# Patient Record
Sex: Female | Born: 1990 | Race: White | Hispanic: No | Marital: Single | Smoking: Never smoker
Health system: Southern US, Community
[De-identification: ages and names within clinical notes are randomized; demographics above are authoritative.]

## PROBLEM LIST (undated history)

## (undated) DIAGNOSIS — G43909 Migraine, unspecified, not intractable, without status migrainosus: Secondary | ICD-10-CM

## (undated) DIAGNOSIS — E063 Autoimmune thyroiditis: Secondary | ICD-10-CM

## (undated) DIAGNOSIS — K824 Cholesterolosis of gallbladder: Secondary | ICD-10-CM

## (undated) DIAGNOSIS — K639 Disease of intestine, unspecified: Secondary | ICD-10-CM

## (undated) DIAGNOSIS — E041 Nontoxic single thyroid nodule: Secondary | ICD-10-CM

## (undated) DIAGNOSIS — K219 Gastro-esophageal reflux disease without esophagitis: Secondary | ICD-10-CM

## (undated) DIAGNOSIS — T7840XA Allergy, unspecified, initial encounter: Secondary | ICD-10-CM

## (undated) HISTORY — DX: Autoimmune thyroiditis: E06.3

## (undated) HISTORY — DX: Gastro-esophageal reflux disease without esophagitis: K21.9

## (undated) HISTORY — DX: Disease of intestine, unspecified: K63.9

## (undated) HISTORY — DX: Allergy, unspecified, initial encounter: T78.40XA

## (undated) HISTORY — DX: Migraine, unspecified, not intractable, without status migrainosus: G43.909

## (undated) HISTORY — DX: Nontoxic single thyroid nodule: E04.1

## (undated) HISTORY — PX: CHOLECYSTECTOMY: SHX55

## (undated) HISTORY — DX: Cholesterolosis of gallbladder: K82.4

## (undated) HISTORY — PX: WISDOM TOOTH EXTRACTION: SHX21

---
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
## 2012-02-24 DIAGNOSIS — E063 Autoimmune thyroiditis: Secondary | ICD-10-CM

## 2012-02-24 DIAGNOSIS — E041 Nontoxic single thyroid nodule: Secondary | ICD-10-CM

## 2012-02-24 HISTORY — DX: Nontoxic single thyroid nodule: E04.1

## 2012-02-24 HISTORY — DX: Autoimmune thyroiditis: E06.3

## 2012-07-26 ENCOUNTER — Encounter: Payer: Self-pay | Admitting: Internal Medicine

## 2012-07-26 ENCOUNTER — Ambulatory Visit (INDEPENDENT_AMBULATORY_CARE_PROVIDER_SITE_OTHER): Payer: BC Managed Care – PPO | Admitting: Internal Medicine

## 2012-07-26 VITALS — BP 102/58 | HR 94 | Temp 98.4°F | Resp 10 | Ht 68.0 in | Wt 122.0 lb

## 2012-07-26 DIAGNOSIS — E063 Autoimmune thyroiditis: Secondary | ICD-10-CM

## 2012-07-26 NOTE — Patient Instructions (Signed)
Please have labs drawn in 1 month and come for a visit in 4 months.

## 2012-07-26 NOTE — Progress Notes (Signed)
Subjective:     Patient ID: Meghan Hayes, female   DOB: [birth year], 22 y.o.   MRN: [id number]  HPI Pt is a 22 y/o woman self-referred for evaluation for hypothyroidism.  Patient tells me that her thyroid tests have been checked for the last 4-5 years but they were normal. She was dx with hypothyroidism in 05/2012, by her ObGyn. Pt remembersthat TSH was around 6 (no records available). She was then referred to [facility] >> obtained the following tests:  TSH 2.46 in 06/27/2012  Anti-TPO 771 (<34) in 06/29/2012. She was recommended to take Synthroid 25 mcg but she did not start it and wanted a second opinion about her condition.  No FH of thyroid ds or AI ds.   She complains of the following: - temperature: body is cold, but face burning >> occasionally hot in body, too - feels SOB, like something sitting on chest, occas. getting a sharp pain with this - arms and legs feel numb, tingling, "like noodles" - dizzy all the time, orthostasis - exhausted, cannot fall asleep at night, cannot get rested, wakes up frequently - occasional tremors, feels like fainting (never actually fainted before) - irritable, moody, feels "out of it", emotional - anxiety >> stressed, worse lately; no depression - palpitations, even at rest, but increased heart rate with exertion - she does not know when her menstrual cycle would come - did not start OCPs, but has tried 2 different OCPs in the past, one caused her a lot of pain.  - has HAs frequently - she has sometimes nauseated when she gets hungry - gains and loses 5 lbs (in the past did not fluctuating that much)  She has had this spx "forever" but they would come separately in the past, while now all are present continuously.   She does not have a primary care physician.  Review of Systems Constitutional: + fatigue, + feeling excessively hot in the face and cold in the body, + hot flashes, + poor sleep Eyes: + blurry vision, no xerophthalmia ENT:  + sore throat, +swelling in throat, + dysphagia/no odynophagia, no hoarseness; +ringing in ears Cardiovascular: + CP/+ SOB/+ palpitations/leg swelling Respiratory: no cough/+ SOB/ + wheezing Gastrointestinal: +  N/no V/D/+ C, + GERD  Musculoskeletal: +  Muscle/+ joint aches, + joint swelling Skin: no rashes, + easy bruising,+ stretch marks, + hair loss and thinning Neurological: + tremors/numbness/tingling/+ dizziness, + headache Psychiatric: no depression/+ anxiety + Irregular menstrual cycles  Past Medical History  Diagnosis Date  . Thyroid disease   . Allergy     seasonal, nuts, wheat, corn, soy   PSx H:  tonsilectomy 2008  History   Social History  . Marital Status: Single    Spouse Name: N/A    Number of Children: 0   Occupational History  . 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 Smoking status: Never Smoker   . Smokeless tobacco: Not on file  . Alcohol Use: No  . Drug Use: No  . Sexually Active: No   Social History Narrative   Regular exercise: no   Caffeine use: no   NKDA  Family History  Problem Relation Age of Onset  . Hypertension Father   . Allergies Father   . Cancer Maternal Grandmother     colon  . Hypertension Maternal Grandmother    Objective:   Physical Exam BP 102/58  Pulse 94  Temp(Src) 98.4 F (36.9 C) (  Oral)  Resp 10  Ht 5\' 8"  (1.727 m)  Wt 122 lb (55.339 kg)  BMI 18.55 kg/m2  SpO2 97%  LMP 07/21/2012 Weight: 122 lb (55.339 kg)  Wt Readings from Last 3 Encounters:  07/26/12 122 lb (55.339 kg)  Constitutional: normal weight, in NAD Eyes: PERRLA, EOMI, no exophthalmos ENT: moist mucous membranes, mild symmetric thyromegaly, no cervical lymphadenopathy Cardiovascular: RRR, No MRG Respiratory: CTA B Gastrointestinal: abdomen soft, NT, ND, BS+ Musculoskeletal: no deformities, strength intact in all 4 Skin: moist, warm, no rashes Neurological: mild tremor with outstretched hands, DTR  normal in all 4    Assessment:     1. Euthyroid Hashimoto thyroiditis     Plan:     I had a long discussion with patient and her mom about the meaning of TSH, free T4 and free T3 and also her increased thyroid antibodies. I explained that this is an autoimmune disease, however it has not affected her thyroid function tests yet, based on the labs that they bring with them today (TSH is 2.4).  I also explained that there can be increased variability of TSH, especially in a patient with high anti-TPO antibodies. Discussed about the fact that a high antibody titer can cause her a sensation of swelling in her anterior neck and sometimes even choking, associated with increased inflammation of the gland. This can happen in periods of stress, illness, lack of sleep, which can render her immune intolerant and make her more sensitive to antibody action, even though the thyroid tests might be normal. -  For now, I recommended that she does not start Synthroid, but will recheck her tests in another month. She opted to have these labs drawn at [facility]. - I will see her back in 4 months to recheck her labs and her symptoms - I recommended that she has a primary care physician, to integrate her care and make sure that her general health status (aside from the thyroid) is normal. She asked me whether I could refer her to [facility] office (done).

## 2012-08-30 ENCOUNTER — Encounter: Payer: Self-pay | Admitting: Nurse Practitioner

## 2012-08-30 ENCOUNTER — Ambulatory Visit (INDEPENDENT_AMBULATORY_CARE_PROVIDER_SITE_OTHER): Payer: BC Managed Care – PPO | Admitting: Nurse Practitioner

## 2012-08-30 VITALS — BP 98/60 | HR 95 | Temp 98.0°F | Ht 68.0 in | Wt 123.0 lb

## 2012-08-30 DIAGNOSIS — F419 Anxiety disorder, unspecified: Secondary | ICD-10-CM

## 2012-08-30 DIAGNOSIS — Z0000 Encounter for general adult medical examination without abnormal findings: Secondary | ICD-10-CM

## 2012-08-30 DIAGNOSIS — F411 Generalized anxiety disorder: Secondary | ICD-10-CM

## 2012-08-30 DIAGNOSIS — R0789 Other chest pain: Secondary | ICD-10-CM

## 2012-08-30 DIAGNOSIS — R636 Underweight: Secondary | ICD-10-CM

## 2012-08-30 DIAGNOSIS — R002 Palpitations: Secondary | ICD-10-CM

## 2012-08-30 NOTE — Patient Instructions (Addendum)
Your EKG looks great, but we will set up exercise stress test, given the uncomfortable feeling you have when you exercise. If you decide you want to explore symptoms of anxiety, I am happy to refer you to for further evaluation. You look great! Pleasure to meet you.    Preventive Care for Adults, Female A healthy lifestyle and preventive care can promote health and wellness. Preventive health guidelines for women include the following key practices.  A routine yearly physical is a good way to check with your caregiver about your health and preventive screening. It is a chance to share any concerns and updates on your health, and to receive a thorough exam.  Visit your dentist for a routine exam and preventive care every 6 months. Brush your teeth twice a day and floss once a day. Good oral hygiene prevents tooth decay and gum disease.  The frequency of eye exams is based on your age, health, family medical history, use of contact lenses, and other factors. Follow your caregiver's recommendations for frequency of eye exams.  Eat a healthy diet. Foods like vegetables, fruits, whole grains, low-fat dairy products, and lean protein foods contain the nutrients you need without too many calories. Decrease your intake of foods high in solid fats, added sugars, and salt. Eat the right amount of calories for you.Get information about a proper diet from your caregiver, if necessary.  Regular physical exercise is one of the most important things you can do for your health. Most adults should get at least 150 minutes of moderate-intensity exercise (any activity that increases your heart rate and causes you to sweat) each week. In addition, most adults need muscle-strengthening exercises on 2 or more days a week.  Maintain a healthy weight. The body mass index (BMI) is a screening tool to identify possible weight problems. It provides an estimate of body fat based on height and weight. Your caregiver can help  determine your BMI, and can help you achieve or maintain a healthy weight.For adults 20 years and older:  A BMI below 18.5 is considered underweight.  A BMI of 18.5 to 24.9 is normal.  A BMI of 25 to 29.9 is considered overweight.  A BMI of 30 and above is considered obese.  Maintain normal blood lipids and cholesterol levels by exercising and minimizing your intake of saturated fat. Eat a balanced diet with plenty of fruit and vegetables. Blood tests for lipids and cholesterol should begin at age 37 and be repeated every 5 years. If your lipid or cholesterol levels are high, you are over 50, or you are at high risk for heart disease, you may need your cholesterol levels checked more frequently.Ongoing high lipid and cholesterol levels should be treated with medicines if diet and exercise are not effective.  If you smoke, find out from your caregiver how to quit. If you do not use tobacco, do not start.  If you are pregnant, do not drink alcohol. If you are breastfeeding, be very cautious about drinking alcohol. If you are not pregnant and choose to drink alcohol, do not exceed 1 drink per day. One drink is considered to be 12 ounces (355 mL) of beer, 5 ounces (148 mL) of wine, or 1.5 ounces (44 mL) of liquor.  Avoid use of street drugs. Do not share needles with anyone. Ask for help if you need support or instructions about stopping the use of drugs.  High blood pressure causes heart disease and increases the risk of stroke. Your  blood pressure should be checked at least every 1 to 2 years. Ongoing high blood pressure should be treated with medicines if weight loss and exercise are not effective.  If you are 52 to 20 years old, ask your caregiver if you should take aspirin to prevent strokes.  Diabetes screening involves taking a blood sample to check your fasting blood sugar level. This should be done once every 3 years, after age 87, if you are within normal weight and without risk factors  for diabetes. Testing should be considered at a younger age or be carried out more frequently if you are overweight and have at least 1 risk factor for diabetes.  Breast cancer screening is essential preventive care for women. You should practice "breast self-awareness." This means understanding the normal appearance and feel of your breasts and may include breast self-examination. Any changes detected, no matter how small, should be reported to a caregiver. Women in their 83s and 30s should have a clinical breast exam (CBE) by a caregiver as part of a regular health exam every 1 to 3 years. After age 37, women should have a CBE every year. Starting at age 54, women should consider having a mammography (breast X-ray test) every year. Women who have a family history of breast cancer should talk to their caregiver about genetic screening. Women at a high risk of breast cancer should talk to their caregivers about having magnetic resonance imaging (MRI) and a mammography every year.  The Pap test is a screening test for cervical cancer. A Pap test can show cell changes on the cervix that might become cervical cancer if left untreated. A Pap test is a procedure in which cells are obtained and examined from the lower end of the uterus (cervix).  Women should have a Pap test starting at age 16.  Between ages 65 and 15, Pap tests should be repeated every 2 years.  Beginning at age 16, you should have a Pap test every 3 years as long as the past 3 Pap tests have been normal.  Some women have medical problems that increase the chance of getting cervical cancer. Talk to your caregiver about these problems. It is especially important to talk to your caregiver if a new problem develops soon after your last Pap test. In these cases, your caregiver may recommend more frequent screening and Pap tests.  The above recommendations are the same for women who have or have not gotten the vaccine for human papillomavirus  (HPV).  If you had a hysterectomy for a problem that was not cancer or a condition that could lead to cancer, then you no longer need Pap tests. Even if you no longer need a Pap test, a regular exam is a good idea to make sure no other problems are starting.  If you are between ages 63 and 85, and you have had normal Pap tests going back 10 years, you no longer need Pap tests. Even if you no longer need a Pap test, a regular exam is a good idea to make sure no other problems are starting.  If you have had past treatment for cervical cancer or a condition that could lead to cancer, you need Pap tests and screening for cancer for at least 20 years after your treatment.  If Pap tests have been discontinued, risk factors (such as a new sexual partner) need to be reassessed to determine if screening should be resumed.  The HPV test is an additional test that  may be used for cervical cancer screening. The HPV test looks for the virus that can cause the cell changes on the cervix. The cells collected during the Pap test can be tested for HPV. The HPV test could be used to screen women aged 20 years and older, and should be used in women of any age who have unclear Pap test results. After the age of 57, women should have HPV testing at the same frequency as a Pap test.  Colorectal cancer can be detected and often prevented. Most routine colorectal cancer screening begins at the age of 25 and continues through age 65. However, your caregiver may recommend screening at an earlier age if you have risk factors for colon cancer. On a yearly basis, your caregiver may provide home test kits to check for hidden blood in the stool. Use of a small camera at the end of a tube, to directly examine the colon (sigmoidoscopy or colonoscopy), can detect the earliest forms of colorectal cancer. Talk to your caregiver about this at age 45, when routine screening begins. Direct examination of the colon should be repeated every 5  to 10 years through age 83, unless early forms of pre-cancerous polyps or small growths are found.  Hepatitis C blood testing is recommended for all people born from [birth year] through 1965 and any individual with known risks for hepatitis C.  Practice safe sex. Use condoms and avoid high-risk sexual practices to reduce the spread of sexually transmitted infections (STIs). STIs include gonorrhea, chlamydia, syphilis, trichomonas, herpes, HPV, and human immunodeficiency virus (HIV). Herpes, HIV, and HPV are viral illnesses that have no cure. They can result in disability, cancer, and death. Sexually active women aged 86 and younger should be checked for chlamydia. Older women with new or multiple partners should also be tested for chlamydia. Testing for other STIs is recommended if you are sexually active and at increased risk.  Osteoporosis is a disease in which the bones lose minerals and strength with aging. This can result in serious bone fractures. The risk of osteoporosis can be identified using a bone density scan. Women ages 38 and over and women at risk for fractures or osteoporosis should discuss screening with their caregivers. Ask your caregiver whether you should take a calcium supplement or vitamin D to reduce the rate of osteoporosis.  Menopause can be associated with physical symptoms and risks. Hormone replacement therapy is available to decrease symptoms and risks. You should talk to your caregiver about whether hormone replacement therapy is right for you.  Use sunscreen with sun protection factor (SPF) of 30 or more. Apply sunscreen liberally and repeatedly throughout the day. You should seek shade when your shadow is shorter than you. Protect yourself by wearing long sleeves, pants, a wide-brimmed hat, and sunglasses year round, whenever you are outdoors.  Once a month, do a whole body skin exam, using a mirror to look at the skin on your back. Notify your caregiver of new moles, moles that  have irregular borders, moles that are larger than a pencil eraser, or moles that have changed in shape or color.  Stay current with required immunizations.  Influenza. You need a dose every fall (or winter). The composition of the flu vaccine changes each year, so being vaccinated once is not enough.  Pneumococcal polysaccharide. You need 1 to 2 doses if you smoke cigarettes or if you have certain chronic medical conditions. You need 1 dose at age 56 (or older) if you have never  been vaccinated.  Tetanus, diphtheria, pertussis (Tdap, Td). Get 1 dose of Tdap vaccine if you are younger than age 20, are over 104 and have contact with an infant, are a Research scientist (physical sciences), are pregnant, or simply want to be protected from whooping cough. After that, you need a Td booster dose every 10 years. Consult your caregiver if you have not had at least 3 tetanus and diphtheria-containing shots sometime in your life or have a deep or dirty wound.  HPV. You need this vaccine if you are a woman age 24 or younger. The vaccine is given in 3 doses over 6 months.  Measles, mumps, rubella (MMR). You need at least 1 dose of MMR if you were born in 1957 or later. You may also need a second dose.  Meningococcal. If you are age 32 to 101 and a first-year college student living in a residence hall, or have one of several medical conditions, you need to get vaccinated against meningococcal disease. You may also need additional booster doses.  Zoster (shingles). If you are age 99 or older, you should get this vaccine.  Varicella (chickenpox). If you have never had chickenpox or you were vaccinated but received only 1 dose, talk to your caregiver to find out if you need this vaccine.  Hepatitis A. You need this vaccine if you have a specific risk factor for hepatitis A virus infection or you simply wish to be protected from this disease. The vaccine is usually given as 2 doses, 6 to 18 months apart.  Hepatitis B. You need this  vaccine if you have a specific risk factor for hepatitis B virus infection or you simply wish to be protected from this disease. The vaccine is given in 3 doses, usually over 6 months. Preventive Services / Frequency Ages 12 to 49  Blood pressure check.** / Every 1 to 2 years.  Lipid and cholesterol check.** / Every 5 years beginning at age 46.  Clinical breast exam.** / Every 3 years for women in their 81s and 30s.  Pap test.** / Every 2 years from ages 77 through 65. Every 3 years starting at age 35 through age 40 or 47 with a history of 3 consecutive normal Pap tests.  HPV screening.** / Every 3 years from ages 34 through ages 81 to 28 with a history of 3 consecutive normal Pap tests.  Hepatitis C blood test.** / For any individual with known risks for hepatitis C.  Skin self-exam. / Monthly.  Influenza immunization.** / Every year.  Pneumococcal polysaccharide immunization.** / 1 to 2 doses if you smoke cigarettes or if you have certain chronic medical conditions.  Tetanus, diphtheria, pertussis (Tdap, Td) immunization. / A one-time dose of Tdap vaccine. After that, you need a Td booster dose every 10 years.  HPV immunization. / 3 doses over 6 months, if you are 57 and younger.  Measles, mumps, rubella (MMR) immunization. / You need at least 1 dose of MMR if you were born in 1957 or later. You may also need a second dose.  Meningococcal immunization. / 1 dose if you are age 89 to 5 and a first-year college student living in a residence hall, or have one of several medical conditions, you need to get vaccinated against meningococcal disease. You may also need additional booster doses.  Varicella immunization.** / Consult your caregiver.  Hepatitis A immunization.** / Consult your caregiver. 2 doses, 6 to 18 months apart.  Hepatitis B immunization.** / Consult your caregiver. 3  doses usually over 6 months. Ages 6 to 14  Blood pressure check.** / Every 1 to 2 years.  Lipid  and cholesterol check.** / Every 5 years beginning at age 61.  Clinical breast exam.** / Every year after age 4.  Mammogram.** / Every year beginning at age 66 and continuing for as long as you are in good health. Consult with your caregiver.  Pap test.** / Every 3 years starting at age 81 through age 56 or 33 with a history of 3 consecutive normal Pap tests.  HPV screening.** / Every 3 years from ages 13 through ages 63 to 25 with a history of 3 consecutive normal Pap tests.  Fecal occult blood test (FOBT) of stool. / Every year beginning at age 85 and continuing until age 25. You may not need to do this test if you get a colonoscopy every 10 years.  Flexible sigmoidoscopy or colonoscopy.** / Every 5 years for a flexible sigmoidoscopy or every 10 years for a colonoscopy beginning at age 56 and continuing until age 72.  Hepatitis C blood test.** / For all people born from [birth year] through 1965 and any individual with known risks for hepatitis C.  Skin self-exam. / Monthly.  Influenza immunization.** / Every year.  Pneumococcal polysaccharide immunization.** / 1 to 2 doses if you smoke cigarettes or if you have certain chronic medical conditions.  Tetanus, diphtheria, pertussis (Tdap, Td) immunization.** / A one-time dose of Tdap vaccine. After that, you need a Td booster dose every 10 years.  Measles, mumps, rubella (MMR) immunization. / You need at least 1 dose of MMR if you were born in 1957 or later. You may also need a second dose.  Varicella immunization.** / Consult your caregiver.  Meningococcal immunization.** / Consult your caregiver.  Hepatitis A immunization.** / Consult your caregiver. 2 doses, 6 to 18 months apart.  Hepatitis B immunization.** / Consult your caregiver. 3 doses, usually over 6 months. Ages 78 and over  Blood pressure check.** / Every 1 to 2 years.  Lipid and cholesterol check.** / Every 5 years beginning at age 10.  Clinical breast exam.** / Every year  after age 41.  Mammogram.** / Every year beginning at age 57 and continuing for as long as you are in good health. Consult with your caregiver.  Pap test.** / Every 3 years starting at age 52 through age 61 or 85 with a 3 consecutive normal Pap tests. Testing can be stopped between 65 and 70 with 3 consecutive normal Pap tests and no abnormal Pap or HPV tests in the past 10 years.  HPV screening.** / Every 3 years from ages 29 through ages 55 or 82 with a history of 3 consecutive normal Pap tests. Testing can be stopped between 65 and 70 with 3 consecutive normal Pap tests and no abnormal Pap or HPV tests in the past 10 years.  Fecal occult blood test (FOBT) of stool. / Every year beginning at age 104 and continuing until age 50. You may not need to do this test if you get a colonoscopy every 10 years.  Flexible sigmoidoscopy or colonoscopy.** / Every 5 years for a flexible sigmoidoscopy or every 10 years for a colonoscopy beginning at age 39 and continuing until age 46.  Hepatitis C blood test.** / For all people born from [birth year] through 1963 and any individual with known risks for hepatitis C.  Osteoporosis screening.** / A one-time screening for women ages 30 and over and women at risk  for fractures or osteoporosis.  Skin self-exam. / Monthly.  Influenza immunization.** / Every year.  Pneumococcal polysaccharide immunization.** / 1 dose at age 27 (or older) if you have never been vaccinated.  Tetanus, diphtheria, pertussis (Tdap, Td) immunization. / A one-time dose of Tdap vaccine if you are over 65 and have contact with an infant, are a Research scientist (physical sciences), or simply want to be protected from whooping cough. After that, you need a Td booster dose every 10 years.  Varicella immunization.** / Consult your caregiver.  Meningococcal immunization.** / Consult your caregiver.  Hepatitis A immunization.** / Consult your caregiver. 2 doses, 6 to 18 months apart.  Hepatitis B immunization.** /  Check with your caregiver. 3 doses, usually over 6 months. ** Family history and personal history of risk and conditions may change your caregiver's recommendations. Document Released: 04/07/2001 Document Revised: 05/04/2011 Document Reviewed: 07/07/2010 [facility] Patient Information 2014 [place], [place].

## 2012-08-30 NOTE — Progress Notes (Signed)
Subjective:    Meghan Hayes is a 22 y.o. female who presents to establish care. She is accompanied by her mother. She was referred by Dr. 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 Meghan Hayes has seen GI in past for chronic constipation. Although she is underweight, she feels that she is too heavy. She c/o palpitations and pain in chest with exertion. She states she does not like to exercise because it feels painful when her heart beats rapidly. Palpitations occur randomly and are intermittent, lasting a few seconds. She expresses concern that her heart beats too rapidly, even at rest. Cardiac risk factors include: family history of hyperlipidemia. Aggravating factors: stress/anxiety. Relieving factors: none, avoiding exertion.. Associated symptoms: none. Patient denies: calf pain, cough, leg swelling and slow heart beat. Meghan Hayes does not want to have labs done at our office because she plans to go to [facility] today where bloodwork will be done. I carefully explained that thyroid labs need to be repeated and requested that she ask for TSH, t3 and free T4, and TPO antibodies.  The following portions of the patient's history were reviewed and updated as appropriate: allergies, current medications, past family history, past medical history, past social history, past surgical history and problem list.  Review of Systems Constitutional: negative for anorexia, chills, fevers and positive for fatigue, reports feels bad all the time, perceives her health as poor. Eyes: negative for contacts/glasses, redness and visual disturbance Respiratory: negative for asthma, cough and wheezing Cardiovascular: positive for chest pain, chest pressure/discomfort, palpitations and feels like heart beats too fast, negative for lower extremity edema and syncope Gastrointestinal: positive for constipation and goes 4-5 days without having bowel movement, negative  for diarrhea and vomiting Genitourinary:negative for dysuria Integument/breast: negative for rash Musculoskeletal:negative for arthralgias, back pain and muscle weakness Neurological: negative for coordination problems, headaches and feels dizzy often Behavioral/Psych: positive for anxiety and verbalizes worry multiple concerns with health, negative for aggressive behavior, behavior problems, decreased appetite, excessive alcohol consumption, illegal drug usage, sleep disturbance and tobacco use Endocrine: positive for nothing, negative for diabetic symptoms including blurry vision, polydipsia, polyphagia and polyuria and temperature intolerance   Objective:    BP 98/60  Pulse 95  Temp(Src) 98 F (36.7 C) (Oral)  Ht 5\' 8"  (1.727 m)  Wt 123 lb (55.792 kg)  BMI 18.71 kg/m2  SpO2 98%  LMP 07/21/2012 General appearance: alert, cooperative, appears stated age and mild distress Head: Normocephalic, without obvious abnormality, atraumatic Eyes: conjunctivae/corneas clear. PERRL, EOM's intact. Fundi benign. Ears: normal TM's and external ear canals both ears Throat: lips, mucosa, and tongue normal; teeth and gums normal Neck: no adenopathy, no carotid bruit, supple, symmetrical, trachea midline and thyroid not enlarged, symmetric, no tenderness/mass/nodules Lungs: clear to auscultation bilaterally Heart: regular rate and rhythm, S1, S2 normal, no murmur, click, rub or gallop Abdomen: soft, non-tender; bowel sounds normal; no masses,  no organomegaly Lymph nodes: Cervical, supraclavicular, and axillary nodes normal.  Cardiographics ECG: normal sinus rhythm   Assessment:  1 preventive care-vaccines up to date per pt report, PAP should begin at 20 yo if sexually active, or sooner if symptoms 2 palpitations/chest discomfort with exertion 3 underweight 4 anxiousness: expresses fear regarding health  5 chronic constipation Plan:    1 Next physical in 5 years, Meghan Hayes declined screening  labs, as she plans to go to [facility] today where blood work will be done, asked her to have them send records  to me & Dr. Elvera Lennox 2 ECG shows NSR, refer to cardiology for exercise stress test 3 BMI 18.7, pt thinks she is overweight-concern for body image disorder, has been diagnosed w/euthyroid Hashimoto  4 Offered to refer for psych eval-Ms Hayes is opposed to taking meds for anxiety, suggested psychology for CBT, she is undecided. 5 Increase water intake, begin probiotics-suggest Align  Follow up in 1 month to discuss exercise stress test, continue to eval anxiety level and body image.

## 2012-09-01 ENCOUNTER — Encounter: Payer: Self-pay | Admitting: Nurse Practitioner

## 2012-12-05 ENCOUNTER — Ambulatory Visit (INDEPENDENT_AMBULATORY_CARE_PROVIDER_SITE_OTHER): Payer: BC Managed Care – PPO | Admitting: Internal Medicine

## 2012-12-05 ENCOUNTER — Encounter: Payer: Self-pay | Admitting: Internal Medicine

## 2012-12-05 VITALS — BP 102/60 | HR 97 | Temp 98.5°F | Resp 10 | Wt 122.5 lb

## 2012-12-05 DIAGNOSIS — E063 Autoimmune thyroiditis: Secondary | ICD-10-CM

## 2012-12-05 NOTE — Progress Notes (Signed)
Subjective:     Patient ID: Meghan Hayes, female   DOB: March 09, 1990, 22 y.o.   MRN: [id number]  HPI Pt is a 22 y/o woman self-referred for evaluation for hypothyroidism. Last visit 4 mo ago. She is here with her mother who offers part of the history.  Reviewed hx: Pt was dx with hypothyroidism in 05/2012, by her ObGyn. Pt remembers that TSH was around 6 (no records available). She was then referred to [facility] >> obtained the following tests:  TSH 2.46 in 06/27/2012, Anti-TPO 771 (<34) in 06/29/2012. She was recommended to take Synthroid 25 mcg but she did not start it and wanted a second opinion about her condition.  At last visit, I recommended to stay off Synthroid with repeat labs in 1 mo:  She had TFTs 09/02/2012: TSH 3.4,  TT4 7.9 (4.5-12), free T4 1.10 (0.82-1.77), TT3 121 (71-180), rT3 19.9 (9.2-24.1), ATA 4.3 (0-0.9), antiTPO Abx 466 (0-34) They bring these labs for me to review today.  She still c/o the following - consistent with prior visit: - now more recent dysmenorrhea - temperature: body is cold, but face burning >> occasionally hot in body, too - feels SOB, like something sitting on chest, occas. getting a sharp pain with this - arms and legs feel numb, tingling - dizzy all the time, orthostasis - exhausted, cannot fall asleep at night, cannot get rested, wakes up frequently - occasional tremors, feels like fainting - irritable, moody, feels "out of it", emotional - anxiety; no depression - less palpitations lately - has HAs frequently - she has sometimes nauseated when she gets hungry  She started to have a gluten-free diet >> feels a little better.   She did established care with a primary care physician.  Other labs from 09/02/2012: - normal CMP - vit D 21.7 >> did not start vit D yet - CRP 14.54 (0-3) - lipids: 165/71/63/88 - DHEAS 283.2 (110-431.7) - Insulin 6.4 - CBC with diff normal exc. MCH slightly low and Monocytes 16 % (4-12%) - ESR 6 -  total testosterone: 28.5 (10-55), free T 0.4 (0-2.2) - LH 10.5, FSH 6/9  - Progesterone 1 - unclear what day of the cycle - MTFHR - no mutation  Review of Systems Constitutional: + fatigue, + feeling excessively hot in the face and cold in the body, + hot flashes, + poor sleep Eyes: no blurry vision, no xerophthalmia ENT: + sore throat, +swelling in throat, + dysphagia/no odynophagia, no hoarseness; +ringing in ears Cardiovascular: + CP/+ SOB/no palpitations/leg swelling Respiratory: no cough/+ SOB/ + wheezing Gastrointestinal: +  N/no V/D/+ C, + GERD  Musculoskeletal: +  Muscle/+ joint aches Skin: no rashes Neurological: + tremors/numbness/tingling/ dizziness, + headache + Irregular menstrual cycles - on OCPs  Objective:   Physical Exam BP 102/60  Pulse 97  Temp(Src) 98.5 F (36.9 C) (Oral)  Resp 10  Wt 122 lb 8 oz (55.566 kg)  BMI 18.63 kg/m2  SpO2 97%    Wt Readings from Last 3 Encounters:  08/30/12 123 lb (55.792 kg)  07/26/12 122 lb (55.339 kg)  Constitutional: thin, in NAD Eyes: PERRLA, EOMI, no exophthalmos ENT: moist mucous membranes, mild symmetric thyromegaly, no cervical lymphadenopathy Cardiovascular: RRR, No MRG Respiratory: CTA B Gastrointestinal: abdomen soft, NT, ND, BS+ Musculoskeletal: no deformities, strength intact in all 4 Skin: moist, warm, no rashes Neurological: mild tremor with outstretched hands, DTR normal in all 4    Assessment:     1. Euthyroid Hashimoto thyroiditis  Plan:     - Pt with h/o elevated TPO Antibodies, but with normal thyroid fxn. Last set of labs from 3 mo ago: normal, antiTPO Abs decreased, but still high. Pt feels a little better after starting the gluten diet, but in general has the same sxs as before. Mom confirms that pt looks and seems better. - For now, I again recommended to stay off Synthroid (will take this off her med list)  - I advised her to start vit D as advised  - will recheck her tests today: TSH, Free T4  and free T3 - I will see her back in 6 months to recheck her labs and her symptoms - She established care with Maximino Sarin, NP, at [facility] office   Office Visit on 12/05/2012  Component Date Value Range Status  . TSH 12/05/2012 1.25  0.35 - 5.50 uIU/mL Final  . Free T4 12/05/2012 0.74  0.60 - 1.60 ng/dL Final  . T3, Free 16/11/9602 2.7  2.3 - 4.2 pg/mL Final   Excellent labs >> continue off Synthroid.

## 2012-12-06 ENCOUNTER — Encounter: Payer: Self-pay | Admitting: Internal Medicine

## 2012-12-06 LAB — TSH: TSH: 1.25 u[IU]/mL (ref 0.35–5.50)

## 2012-12-06 LAB — T3, FREE: T3, Free: 2.7 pg/mL (ref 2.3–4.2)

## 2012-12-06 LAB — T4, FREE: Free T4: 0.74 ng/dL (ref 0.60–1.60)

## 2013-06-05 ENCOUNTER — Ambulatory Visit: Payer: BC Managed Care – PPO | Admitting: Internal Medicine

## 2013-07-07 ENCOUNTER — Other Ambulatory Visit: Payer: Self-pay | Admitting: Nurse Practitioner

## 2013-07-07 ENCOUNTER — Ambulatory Visit
Admission: RE | Admit: 2013-07-07 | Discharge: 2013-07-07 | Disposition: A | Discharge status: Home / Self Care | Payer: BC Managed Care – PPO | Source: Ambulatory Visit | Attending: Nurse Practitioner | Admitting: Nurse Practitioner

## 2013-07-07 DIAGNOSIS — E063 Autoimmune thyroiditis: Secondary | ICD-10-CM

## 2014-07-03 ENCOUNTER — Other Ambulatory Visit: Payer: Self-pay | Admitting: Nurse Practitioner

## 2014-07-03 DIAGNOSIS — E063 Autoimmune thyroiditis: Secondary | ICD-10-CM

## 2014-07-06 ENCOUNTER — Ambulatory Visit
Admission: RE | Admit: 2014-07-06 | Discharge: 2014-07-06 | Disposition: A | Discharge status: Home / Self Care | Payer: BLUE CROSS/BLUE SHIELD | Source: Ambulatory Visit | Attending: Nurse Practitioner | Admitting: Nurse Practitioner

## 2014-07-06 DIAGNOSIS — E063 Autoimmune thyroiditis: Secondary | ICD-10-CM

## 2014-08-20 DIAGNOSIS — R1033 Periumbilical pain: Secondary | ICD-10-CM | POA: Insufficient documentation

## 2014-10-11 ENCOUNTER — Other Ambulatory Visit: Payer: Self-pay | Admitting: Surgery

## 2014-10-11 DIAGNOSIS — E063 Autoimmune thyroiditis: Secondary | ICD-10-CM

## 2015-01-04 ENCOUNTER — Ambulatory Visit
Admission: RE | Admit: 2015-01-04 | Discharge: 2015-01-04 | Disposition: A | Discharge status: Home / Self Care | Payer: BLUE CROSS/BLUE SHIELD | Source: Ambulatory Visit | Attending: Surgery | Admitting: Surgery

## 2015-01-04 DIAGNOSIS — E063 Autoimmune thyroiditis: Secondary | ICD-10-CM

## 2015-01-11 ENCOUNTER — Other Ambulatory Visit (INDEPENDENT_AMBULATORY_CARE_PROVIDER_SITE_OTHER): Payer: BLUE CROSS/BLUE SHIELD

## 2015-01-11 ENCOUNTER — Ambulatory Visit (INDEPENDENT_AMBULATORY_CARE_PROVIDER_SITE_OTHER): Payer: BLUE CROSS/BLUE SHIELD | Admitting: Gastroenterology

## 2015-01-11 ENCOUNTER — Other Ambulatory Visit: Payer: BLUE CROSS/BLUE SHIELD

## 2015-01-11 ENCOUNTER — Encounter: Payer: Self-pay | Admitting: Gastroenterology

## 2015-01-11 VITALS — BP 106/68 | HR 70 | Ht 68.0 in | Wt 125.0 lb

## 2015-01-11 DIAGNOSIS — R109 Unspecified abdominal pain: Secondary | ICD-10-CM

## 2015-01-11 DIAGNOSIS — K824 Cholesterolosis of gallbladder: Secondary | ICD-10-CM

## 2015-01-11 LAB — CBC WITH DIFFERENTIAL/PLATELET
BASOS PCT: 0.6 % (ref 0.0–3.0)
Basophils Absolute: 0 10*3/uL (ref 0.0–0.1)
EOS PCT: 0.5 % (ref 0.0–5.0)
Eosinophils Absolute: 0 10*3/uL (ref 0.0–0.7)
HCT: 42.3 % (ref 36.0–46.0)
HEMOGLOBIN: 14 g/dL (ref 12.0–15.0)
LYMPHS PCT: 30.5 % (ref 12.0–46.0)
Lymphs Abs: 2.4 10*3/uL (ref 0.7–4.0)
MCHC: 33.1 g/dL (ref 30.0–36.0)
MCV: 88.9 fl (ref 78.0–100.0)
MONOS PCT: 9.1 % (ref 3.0–12.0)
Monocytes Absolute: 0.7 10*3/uL (ref 0.1–1.0)
NEUTROS PCT: 59.3 % (ref 43.0–77.0)
Neutro Abs: 4.6 10*3/uL (ref 1.4–7.7)
PLATELETS: 415 10*3/uL — AB (ref 150.0–400.0)
RBC: 4.76 Mil/uL (ref 3.87–5.11)
RDW: 12.7 % (ref 11.5–15.5)
WBC: 7.8 10*3/uL (ref 4.0–10.5)

## 2015-01-11 LAB — COMPREHENSIVE METABOLIC PANEL
ALT: 8 U/L (ref 0–35)
AST: 14 U/L (ref 0–37)
Albumin: 4.7 g/dL (ref 3.5–5.2)
Alkaline Phosphatase: 61 U/L (ref 39–117)
BILIRUBIN TOTAL: 0.9 mg/dL (ref 0.2–1.2)
BUN: 8 mg/dL (ref 6–23)
CO2: 28 meq/L (ref 19–32)
Calcium: 9.8 mg/dL (ref 8.4–10.5)
Chloride: 104 mEq/L (ref 96–112)
Creatinine, Ser: 0.76 mg/dL (ref 0.40–1.20)
GFR: 100.52 mL/min (ref >60.00)
GLUCOSE: 83 mg/dL (ref 70–99)
Potassium: 4.3 mEq/L (ref 3.5–5.1)
SODIUM: 139 meq/L (ref 135–145)
Total Protein: 7.7 g/dL (ref 6.0–8.3)

## 2015-01-11 MED ORDER — LIDOCAINE 5 % EX PTCH: 1.0000 | MEDICATED_PATCH | CUTANEOUS | Status: DC

## 2015-01-11 NOTE — Patient Instructions (Signed)
You have been scheduled for an abdominal ultrasound at [facility] (1st floor of hospital) on 01/18/2015 at 8:30am. Please arrive 15 minutes prior to your appointment for registration. Make certain not to have anything to eat or drink 6 hours prior to your appointment. Should you need to reschedule your appointment, please contact radiology at [phone]. This test typically takes about 30 minutes to perform.   Your physician has requested that you go to the basement for lab work before leaving today.   We have sent the following medications to your pharmacy for you to pick up at your convenience: Lidocaine Patch

## 2015-01-11 NOTE — Progress Notes (Signed)
HPI :  24 y/o female seen in consultation for abdominal pain.   Pain is in the right lower quadrant, but can be in the LLQ at times but mostly RLQ. She reports it started in June of this year. She reports since it first started it can be quite sensitive and tender to the touch, however there are fluctuations in severity. Pain rated roughly 5-6/10 at baseline, rated 10/10 when severe and flaring. She reports severe pain rated 10/10 about 4 times since June or so. Pain lasts hours when severe, up to 10 hours at a time before abating. She is never pain free and feels pain constantly 24/7. She is eating okay in general. She is not vomiting. She thinks eating usually makes her feel worse, usually within 20 minutes of eating she can have some worsening of the pain. Positional changes can also make it worse. She has some baseline chronic constipation, and usually takes miralax for this. She is having about one BM per day or so. Having a BM usually does not relieve the pain. No blood in the stools. No weight loss. No fevers or nightsweats. She does not use medications routinely, uses vitamin supplements. She has multiple food allergies reported. She has intolerances to several foods which cause anaphylaxis. Dairy causes abdominal swelling. She has been tested for celiac disease by an allergist. No history of abdominal trauma.   Grandmother had colon cancer, diagnosed age 55. Grandfather had colon polyps. No Crohns of IBD. Mother had an adenomatous polyp at age 68.   Of note, she had a 22mm gallbladder polyp noted on an [place] in 2011. This has not been followed up with imaging since that time.    Past Medical History  Diagnosis Date  . Thyroid disease   . Allergy     seasonal, nuts, wheat, corn, soy  . GERD (gastroesophageal reflux disease)   . Migraines      Past Surgical History  Procedure Laterality Date  . Tonsillectomy  2008   Family History  Problem Relation Age of Onset  . Hypertension Father    . Allergies Father   . Hyperlipidemia Father   . Cancer Maternal Grandmother     colon  . Hypertension Maternal Grandmother   . Hypertension Paternal Uncle    Social History  Substance Use Topics  . Smoking status: Never Smoker   . Smokeless tobacco: None  . Alcohol Use: No   Current Outpatient Prescriptions  Medication Sig Dispense Refill  . diphenhydrAMINE (BENADRYL) 25 mg capsule Take 25 mg by mouth every 6 (six) hours as needed for itching.    Marland Kitchen EPIPEN 2-PAK 0.3 MG/0.3ML SOAJ Inject 0.3 mg into the muscle once. Use if needed.     No current facility-administered medications for this visit.   No Known Allergies   Review of Systems: All systems reviewed and negative except where noted in HPI.   No labs on file other than TSH  US abdomen from 2011 - 61mm gallbladder polyp   Physical Exam: BP 106/68 mmHg  Pulse 70  Ht 5\' 8"  (1.727 m)  Wt 125 lb (56.7 kg)  BMI 19.01 kg/m2 Constitutional: Pleasant,well-developed, female in no acute distress. HEENT: Normocephalic and atraumatic. Conjunctivae are normal. No scleral icterus. Neck supple.  Cardiovascular: Normal rate, regular rhythm.  Pulmonary/chest: Effort normal and breath sounds normal. No wheezing, rales or rhonchi. Abdominal: Soft, nondistended, tenderness to palpation to light touch which reproduces pain. Equivocal Carnett sign. Bowel sounds active throughout. There are no  masses palpable. No hepatomegaly. Extremities: no edema Lymphadenopathy: No cervical adenopathy noted. Neurological: Alert and oriented to person place and time. Skin: Skin is warm and dry. No rashes noted. Psychiatric: Normal mood and affect. Behavior is normal.   ASSESSMENT AND PLAN: 24 y/o female presenting with chronic abdominal pain since June. History as above. Constant pain present 24/7, with fluctuations in severity. Eating does seem to make it worse, and positional changes can make it better/worse. On exam the pain is easily reproduced  with palpation. I discussed differential with her. Given her prandial association I am recommending cross sectional imaging to further evaluate her pain. Patient and mother were hesitant to have any type of contrast study given her thyroid state, and preferred imaging without contrast. I explained in light of her symptoms, cross sectional imaging with IV and oral contrast would be the best modality to image her pain and could discuss options with radiology, although I don't think contrast would be an issue for her, and we could do the exam with just oral contrast. Their preference was to avoid this if possible, and I offered her an abdominal US given their hesitancy with cross sectional imaging, and this can evaluate her GB polyp which has not been followed up since noted in 2011, although I don't think this is the cause of her pain. If [place] is negative, will again discuss with them cross sectional imaging with either CT or MRI. I'll also obtain CBC and CMP in the interim to ensure normal.   Otherwise, based on exam and constant pain that can be altered with positional changes, she could have abdominal wall pain. I offered her a trial of capsaicin cream or lidocaine patch to see if this helps, and she wanted to try to lidocaine patch. If her imaging is negative we may consider a pain management consult for trigger point injection  Patient and mother agreed with the plan, all questions answered, will await labs and imaging and her course, and notify her of the results.   Lavaca Cellar, MD [facility] Pager [phone]

## 2015-01-15 ENCOUNTER — Telehealth: Payer: Self-pay | Admitting: Gastroenterology

## 2015-01-15 NOTE — Telephone Encounter (Signed)
See results note. 

## 2015-01-18 ENCOUNTER — Ambulatory Visit (HOSPITAL_COMMUNITY)
Admission: RE | Admit: 2015-01-18 | Discharge: 2015-01-18 | Disposition: A | Discharge status: Home / Self Care | Payer: BLUE CROSS/BLUE SHIELD | Source: Ambulatory Visit | Attending: Gastroenterology | Admitting: Gastroenterology

## 2015-01-18 DIAGNOSIS — R109 Unspecified abdominal pain: Secondary | ICD-10-CM | POA: Insufficient documentation

## 2015-01-18 DIAGNOSIS — K824 Cholesterolosis of gallbladder: Secondary | ICD-10-CM

## 2015-01-21 NOTE — Progress Notes (Signed)
Ok sounds good. We will await CT scan

## 2015-02-24 DIAGNOSIS — K6389 Other specified diseases of intestine: Secondary | ICD-10-CM

## 2015-02-24 HISTORY — DX: Other specified diseases of intestine: K63.89

## 2015-10-03 DIAGNOSIS — M9903 Segmental and somatic dysfunction of lumbar region: Secondary | ICD-10-CM | POA: Diagnosis not present

## 2015-10-03 DIAGNOSIS — M545 Low back pain: Secondary | ICD-10-CM | POA: Diagnosis not present

## 2015-10-03 DIAGNOSIS — M542 Cervicalgia: Secondary | ICD-10-CM | POA: Diagnosis not present

## 2015-10-03 DIAGNOSIS — M9901 Segmental and somatic dysfunction of cervical region: Secondary | ICD-10-CM | POA: Diagnosis not present

## 2015-10-31 DIAGNOSIS — E063 Autoimmune thyroiditis: Secondary | ICD-10-CM | POA: Diagnosis not present

## 2015-10-31 DIAGNOSIS — R7989 Other specified abnormal findings of blood chemistry: Secondary | ICD-10-CM | POA: Diagnosis not present

## 2015-10-31 DIAGNOSIS — E559 Vitamin D deficiency, unspecified: Secondary | ICD-10-CM | POA: Diagnosis not present

## 2015-11-22 DIAGNOSIS — M9902 Segmental and somatic dysfunction of thoracic region: Secondary | ICD-10-CM | POA: Diagnosis not present

## 2015-11-22 DIAGNOSIS — M9901 Segmental and somatic dysfunction of cervical region: Secondary | ICD-10-CM | POA: Diagnosis not present

## 2015-11-22 DIAGNOSIS — M542 Cervicalgia: Secondary | ICD-10-CM | POA: Diagnosis not present

## 2015-11-22 DIAGNOSIS — M546 Pain in thoracic spine: Secondary | ICD-10-CM | POA: Diagnosis not present

## 2015-11-26 DIAGNOSIS — M9901 Segmental and somatic dysfunction of cervical region: Secondary | ICD-10-CM | POA: Diagnosis not present

## 2015-11-26 DIAGNOSIS — M546 Pain in thoracic spine: Secondary | ICD-10-CM | POA: Diagnosis not present

## 2015-11-26 DIAGNOSIS — M9902 Segmental and somatic dysfunction of thoracic region: Secondary | ICD-10-CM | POA: Diagnosis not present

## 2015-11-26 DIAGNOSIS — M542 Cervicalgia: Secondary | ICD-10-CM | POA: Diagnosis not present

## 2015-11-29 DIAGNOSIS — M542 Cervicalgia: Secondary | ICD-10-CM | POA: Diagnosis not present

## 2015-11-29 DIAGNOSIS — M9902 Segmental and somatic dysfunction of thoracic region: Secondary | ICD-10-CM | POA: Diagnosis not present

## 2015-11-29 DIAGNOSIS — M9901 Segmental and somatic dysfunction of cervical region: Secondary | ICD-10-CM | POA: Diagnosis not present

## 2015-11-29 DIAGNOSIS — M546 Pain in thoracic spine: Secondary | ICD-10-CM | POA: Diagnosis not present

## 2015-12-09 DIAGNOSIS — M546 Pain in thoracic spine: Secondary | ICD-10-CM | POA: Diagnosis not present

## 2015-12-09 DIAGNOSIS — M9902 Segmental and somatic dysfunction of thoracic region: Secondary | ICD-10-CM | POA: Diagnosis not present

## 2015-12-09 DIAGNOSIS — M9901 Segmental and somatic dysfunction of cervical region: Secondary | ICD-10-CM | POA: Diagnosis not present

## 2015-12-09 DIAGNOSIS — M542 Cervicalgia: Secondary | ICD-10-CM | POA: Diagnosis not present

## 2015-12-16 DIAGNOSIS — M9902 Segmental and somatic dysfunction of thoracic region: Secondary | ICD-10-CM | POA: Diagnosis not present

## 2015-12-16 DIAGNOSIS — M546 Pain in thoracic spine: Secondary | ICD-10-CM | POA: Diagnosis not present

## 2015-12-16 DIAGNOSIS — M9901 Segmental and somatic dysfunction of cervical region: Secondary | ICD-10-CM | POA: Diagnosis not present

## 2015-12-16 DIAGNOSIS — M542 Cervicalgia: Secondary | ICD-10-CM | POA: Diagnosis not present

## 2015-12-30 DIAGNOSIS — M542 Cervicalgia: Secondary | ICD-10-CM | POA: Diagnosis not present

## 2015-12-30 DIAGNOSIS — M546 Pain in thoracic spine: Secondary | ICD-10-CM | POA: Diagnosis not present

## 2015-12-30 DIAGNOSIS — M9901 Segmental and somatic dysfunction of cervical region: Secondary | ICD-10-CM | POA: Diagnosis not present

## 2015-12-30 DIAGNOSIS — M9902 Segmental and somatic dysfunction of thoracic region: Secondary | ICD-10-CM | POA: Diagnosis not present

## 2016-01-08 DIAGNOSIS — M542 Cervicalgia: Secondary | ICD-10-CM | POA: Diagnosis not present

## 2016-01-08 DIAGNOSIS — M9902 Segmental and somatic dysfunction of thoracic region: Secondary | ICD-10-CM | POA: Diagnosis not present

## 2016-01-08 DIAGNOSIS — M9901 Segmental and somatic dysfunction of cervical region: Secondary | ICD-10-CM | POA: Diagnosis not present

## 2016-01-08 DIAGNOSIS — M546 Pain in thoracic spine: Secondary | ICD-10-CM | POA: Diagnosis not present

## 2016-01-20 DIAGNOSIS — M9902 Segmental and somatic dysfunction of thoracic region: Secondary | ICD-10-CM | POA: Diagnosis not present

## 2016-01-20 DIAGNOSIS — M546 Pain in thoracic spine: Secondary | ICD-10-CM | POA: Diagnosis not present

## 2016-01-20 DIAGNOSIS — M542 Cervicalgia: Secondary | ICD-10-CM | POA: Diagnosis not present

## 2016-01-20 DIAGNOSIS — M9901 Segmental and somatic dysfunction of cervical region: Secondary | ICD-10-CM | POA: Diagnosis not present

## 2016-02-03 DIAGNOSIS — M546 Pain in thoracic spine: Secondary | ICD-10-CM | POA: Diagnosis not present

## 2016-02-03 DIAGNOSIS — M9902 Segmental and somatic dysfunction of thoracic region: Secondary | ICD-10-CM | POA: Diagnosis not present

## 2016-02-03 DIAGNOSIS — M9901 Segmental and somatic dysfunction of cervical region: Secondary | ICD-10-CM | POA: Diagnosis not present

## 2016-02-03 DIAGNOSIS — M542 Cervicalgia: Secondary | ICD-10-CM | POA: Diagnosis not present

## 2016-02-12 DIAGNOSIS — M9901 Segmental and somatic dysfunction of cervical region: Secondary | ICD-10-CM | POA: Diagnosis not present

## 2016-02-12 DIAGNOSIS — M546 Pain in thoracic spine: Secondary | ICD-10-CM | POA: Diagnosis not present

## 2016-02-12 DIAGNOSIS — M9902 Segmental and somatic dysfunction of thoracic region: Secondary | ICD-10-CM | POA: Diagnosis not present

## 2016-02-12 DIAGNOSIS — M542 Cervicalgia: Secondary | ICD-10-CM | POA: Diagnosis not present

## 2016-03-25 DIAGNOSIS — M542 Cervicalgia: Secondary | ICD-10-CM | POA: Diagnosis not present

## 2016-03-25 DIAGNOSIS — M546 Pain in thoracic spine: Secondary | ICD-10-CM | POA: Diagnosis not present

## 2016-03-25 DIAGNOSIS — M9902 Segmental and somatic dysfunction of thoracic region: Secondary | ICD-10-CM | POA: Diagnosis not present

## 2016-03-25 DIAGNOSIS — M9901 Segmental and somatic dysfunction of cervical region: Secondary | ICD-10-CM | POA: Diagnosis not present

## 2016-04-14 ENCOUNTER — Ambulatory Visit (INDEPENDENT_AMBULATORY_CARE_PROVIDER_SITE_OTHER): Payer: BLUE CROSS/BLUE SHIELD | Admitting: Family Medicine

## 2016-04-14 ENCOUNTER — Encounter: Payer: Self-pay | Admitting: Family Medicine

## 2016-04-14 VITALS — BP 114/78 | HR 110 | Temp 98.3°F | Resp 20 | Ht 68.0 in | Wt 122.2 lb

## 2016-04-14 DIAGNOSIS — R070 Pain in throat: Secondary | ICD-10-CM | POA: Diagnosis not present

## 2016-04-14 DIAGNOSIS — E559 Vitamin D deficiency, unspecified: Secondary | ICD-10-CM

## 2016-04-14 DIAGNOSIS — E063 Autoimmune thyroiditis: Secondary | ICD-10-CM | POA: Diagnosis not present

## 2016-04-14 DIAGNOSIS — K219 Gastro-esophageal reflux disease without esophagitis: Secondary | ICD-10-CM

## 2016-04-14 DIAGNOSIS — Z7689 Persons encountering health services in other specified circumstances: Secondary | ICD-10-CM | POA: Diagnosis not present

## 2016-04-14 DIAGNOSIS — E041 Nontoxic single thyroid nodule: Secondary | ICD-10-CM

## 2016-04-14 MED ORDER — PANTOPRAZOLE SODIUM 40 MG PO TBEC: 40.0000 mg | DELAYED_RELEASE_TABLET | Freq: Every day | ORAL | 3 refills | Status: DC

## 2016-04-14 NOTE — Progress Notes (Signed)
Patient ID: Meghan Hayes, female  DOB: 04-12-1990, 26 y.o.   MRN: [id number] Patient Care Team    Relationship Specialty Notifications Start End  Ma Hillock, DO PCP - General Family Medicine  04/14/16   Philemon Kingdom, MD Consulting Physician Internal Medicine  04/14/16    Comment: endocrine  Manus Gunning, MD Consulting Physician Gastroenterology  04/14/16   Aloha Gell, MD Consulting Physician Obstetrics and Gynecology  04/14/16     Subjective:  Meghan Hayes is a 26 y.o.  female present for new patient establishment. All past medical history, surgical history, allergies, family history, immunizations, medications and social history were obtained and entered in the electronic medical record today. All recent labs, ED visits and hospitalizations within the last year were reviewed. Pt presents with her mother today, which contribute  also to history.   Throat pain: Pt presents for throat pain of 2 weeks duration. She reports a "severe" pain in her throat, that woke her up about 2 weeks ago. She reports her throat feels very dry. She denies fever, chills, URI, allergy issues. She has tried to drink more liquids which did not seem ot help. She reports she had a similar feeling in the past and it was when she was in the dessert in dry air. She has tried humidifier use and it was not helpful. She has had a h/o GERD in the past, but could not take the prevacid because it made her feel odd. She has had some heartburn the last few days. She has a h/o thyroiditis and left thyroid nodule. Last [place] 2016 with 1.2 cm solid nodule thyroid vs parathyroid adenoma. Pt has had elevated anti-TPO of 771 in the past, that they report have decreased to 130. She has been seen 2 week s ago with Sanda Klein, NP and started on thyroid replacement. She has never taken thryoid replacement in the past. She is experiencing palpitations and tachycardia. Unfortunately above provider has now moved/quit the  practice and is no longer available. Pt reports she was supplied 1 years worth of medicine.  She states she has experienced her thyroid enlargement in the past and it did not give her symptoms like she is experiencing now. She denies any difficulty breathing or swallowing liquids or solids.   01/04/2015 [place]: IMPRESSION: 1.  Thyroid gland size stable to slightly smaller in size. 2. 1.2 cm solid nodule within the posterior aspect of the left thyroid gland versus a parathyroid nodule. No interim change from prior exam. Again a parathyroid adenoma cannot be excluded.  There is no immunization history on file for this patient.   Past Medical History:  Diagnosis Date  . Allergy    seasonal, nuts, wheat, corn, soy  . Gallbladder polyp 2011   6 mm has had repeat imaging and stable.   Marland Kitchen GERD (gastroesophageal reflux disease)   . Hashimoto's disease 2014  . Ileocecal valve syndrome 2017   followed by dr. Havery Moros  . Migraines   . Thyroid nodule 2014   last [place] 2016: 1.2cm thyroid nodule vs PTH adenoma.    Allergies  Allergen Reactions  . Corn Oil Anaphylaxis  . Dairy Aid  [Lactase] Swelling    All dairy products make intestines swell  . Other Anaphylaxis  . Wheat Bran Anaphylaxis  . Soy Allergy    Past Surgical History:  Procedure Laterality Date  . TONSILLECTOMY  2008   Family History  Problem Relation Age of Onset  . Hypertension Father   .  Allergies Father   . Hyperlipidemia Father   . Hypertension Maternal Grandmother   . Colon cancer Maternal Grandmother     colon  . Hypertension Paternal Uncle    Social History   Social History  . Marital status: Single    Spouse name: N/A  . Number of children: 0  . Years of education: BA   Occupational History  . Not on file.   Social History Main Topics  . Smoking status: Never Smoker  . Smokeless tobacco: Never Used  . Alcohol use No  . Drug use: No  . Sexual activity: No   Other Topics Concern  . Not on file    Social History Narrative   Pt is single. Just finished her BA degree ans searching for employment.    Takes a daily vitamin.    Wears her seatbelt and bicycle helmet.    Smoke detector in the home.    Feels safe in her relationships.    Allergies as of 04/14/2016      Reactions   Corn Oil Anaphylaxis   Dairy Aid  [lactase] Swelling   All dairy products make intestines swell   Other Anaphylaxis   Wheat Bran Anaphylaxis   Soy Allergy       Medication List       Accurate as of 04/14/16  5:44 PM. Always use your most recent med list.          EPIPEN 2-PAK 0.3 mg/0.3 mL Soaj injection Generic drug:  EPINEPHrine INJECT 0.3 MLS (0.3 MG TOTAL) INTO THE MUSCLE ONCE AS NEEDED FOR ANAPHYLAXIS.   NP THYROID 30 MG tablet Generic drug:  thyroid   OVER THE COUNTER MEDICATION Vitamin A,D,K   pantoprazole 40 MG tablet Commonly known as:  PROTONIX Take 1 tablet (40 mg total) by mouth daily.   vitamin C 250 MG tablet Commonly known as:  ASCORBIC ACID Take 250 mg by mouth daily.        No results found for this or any previous visit (from the past 2160 hour(s)).  US Abdomen Complete  Result Date: 01/18/2015 CLINICAL DATA:  Abdominal pain. EXAM: ULTRASOUND ABDOMEN COMPLETE COMPARISON:  None. FINDINGS: Gallbladder: 6 mm non mobile, non shadowing echogenic focus noted. This could represent a polyp or tumefactive adherent sludge. Gallbladder wall thickness normal at 2 mm. Negative Murphy sign. Common bile duct: Diameter: 3 mm Liver: No focal lesion identified. Within normal limits in parenchymal echogenicity. IVC: No abnormality visualized. Pancreas: Visualized portion unremarkable. Spleen: Size and appearance within normal limits. Right Kidney: Length: 11.1 cm. Echogenicity within normal limits. No mass or hydronephrosis visualized. Left Kidney: Length: 11.3 cm. Echogenicity within normal limits. No mass or hydronephrosis visualized. Abdominal aorta: No aneurysm visualized. Other  findings: None. IMPRESSION: 6 mm non mobile, non shadowing echogenic focus noted. This could represent a polyp or tumefactive adherent sludge. No biliary distention . Exam otherwise negative. Electronically Signed   By: Marcello Moores  Register   On: 01/18/2015 09:17     ROS: 14 pt review of systems performed and negative (unless mentioned in an HPI)  Objective: BP 114/78 (BP Location: Right Arm, Patient Position: Sitting, Cuff Size: Normal)   Pulse (!) 110   Temp 98.3 F (36.8 C)   Resp 20   Ht 5\' 8"  (1.727 m)   Wt 122 lb 4 oz (55.5 kg)   LMP 03/09/2016   SpO2 98%   BMI 18.59 kg/m  Gen: Afebrile. No acute distress. Nontoxic in appearance, well-developed, well-nourished,  Thin caucasian female.  HENT: AT. [place]. Bilateral TM visualized and normal in appearance, normal external auditory canal. MMM, no oral lesions, adequate dentition. Bilateral nares within normal limits. Throat without erythema, ulcerations or exudates. no Cough on exam, no hoarseness on exam. Small vesicle left side of uvula (pt states has been there for a year).  Eyes:Pupils Equal Round Reactive to light, Extraocular movements intact,  Conjunctiva without redness, discharge or icterus. Neck/lymp/endocrine: Supple,no lymphadenopathy, moderately enlarge non-tonder thyromegaly CV: RRR (not tachycardic on exam- was on check in), no edema, +2/4 P posterior tibialis pulses.  Chest: CTAB, no wheeze, rhonchi or crackles.  Abd: Soft. NTND. BS present. . Neuro/Msk:  Normal gait. PERLA. EOMi. Alert. Oriented x3.   Psych: mildly anxious, otherwise normal affect, dress and demeanor. Normal speech. Normal thought content and judgment.  Assessment/plan: Yetive Santillana is a 26 y.o. female present for establishment of care with acute complaint.  Vitamin D deficiency Hashimoto's thyroiditis Thyroid nodule Throat pain Gastroesophageal reflux disease without esophagitis - pt is hard to grasp as far as full picture. She has a long standing h/o  thyroid disease and abnormal neck US, but has not followed with endocrine. She just started with an integrative medicine NP a few weeks ago, and that provider is unavailable now. I do not have records of any of her "specialist" they report she sees for integrative medicine or kinesiology etc.  - She does have an enlarged thyroid on exam. She has had an abnormal [place] in 2016 without follow up. She is having palpitations/tachycardia. Starting with that will order a repeat US, stop thyroid supplement if palpitations persist.  Will try to get records to see what her levels even were prior to starting medicine. Testing now would not change management since she has is on lowest dose and only started about 2 weeks ago. She reports fatigue as reason for starting medicine to begin with, however [place] 2016 had concerns for poss PTH adenoma and pt has had low vit d in the past. Again, she just had labs, but I do not have records.  - consider PTH/Ca/Vitd/TSH/T3-T4 in the future after [place] and records.  - Start Protonix (omeprazole CTRx). GERD diet.  - US Soft Tissue Head/Neck; Future - F/U 2-4 weeks, and will complete labs at that time. She is to f/u sooner if worsening or [place] changes, would perform labs sooner. Discussed possibility of referral if worsening and she would rather see GI if that is needed.   Return in about 4 weeks (around 05/12/2016), or throat pain.   Greater than 45 minutes was spent with patient, greater than 50% of that time was spent face-to-face with patient counseling and/or coordinating care.   Electronically signed by: Howard Pouch, DO [place]

## 2016-04-14 NOTE — Patient Instructions (Addendum)
I will order a thyriod Ultrasound at [facility].   Start PPI, I will call one in for you to start tomorrow.    Please help [place] help you:  We are honored you have chosen [facility] [place] for your Primary Care home. Below you will find basic instructions that you may need to access in the future. Please help [place] help you by reading the instructions, which cover many of the frequent questions we experience.   Prescription refills and request:  -In order to allow more efficient response time, please call your pharmacy for all refills. They will forward the request electronically to [place]. This allows for the quickest possible response. Request left on a nurse line can take longer to refill, since these are checked as time allows between office patients and other phone calls.  - refill request can take up to 3-5 working days to complete.  - If request is sent electronically and request is appropiate, it is usually completed in 1-2 business days.  - all patients will need to be seen routinely for all chronic medical conditions requiring prescription medications (see follow-up below). If you are overdue for follow up on your condition, you will be asked to make an appointment and we will call in enough medication to cover you until your appointment (up to 30 days).  - all controlled substances will require a face to face visit to request/refill.  - if you desire your prescriptions to go through a new pharmacy, and have an active script at original pharmacy, you will need to call your pharmacy and have scripts transferred to new pharmacy. This is completed between the pharmacy locations and not by your provider.    Results: If any images or labs were ordered, it can take up to 1 week to get results depending on the test ordered and the lab/facility running and resulting the test. - Normal or stable results, which do not need further discussion, will be released to your mychart immediately with attached note  to you. A call will not be generated for normal results. Please make certain to sign up for mychart. If you have questions on how to activate your mychart you can call the front office.  - If your results need further discussion, our office will attempt to contact you via phone, and if unable to reach you after 2 attempts, we will release your abnormal result to your mychart with instructions.  - All results will be automatically released in mychart after 1 week.  - Your provider will provide you with explanation and instruction on all relevant material in your results. Please keep in mind, results and labs may appear confusing or abnormal to the untrained eye, but it does not mean they are actually abnormal for you personally. If you have any questions about your results that are not covered, or you desire more detailed explanation than what was provided, you should make an appointment with your provider to do so.   Our office handles many outgoing and incoming calls daily. If we have not contacted you within 1 week about your results, please check your mychart to see if there is a message first and if not, then contact our office.  In helping with this matter, you help decrease call volume, and therefore allow [place] to be able to respond to patients needs more efficiently.   Acute office visits (sick visit):  An acute visit is intended for a new problem and are scheduled in shorter time  slots to allow schedule openings for patients with new problems. This is the appropriate visit to discuss a new problem. In order to provide you with excellent quality medical care with proper time for you to explain your problem, have an exam and receive treatment with instructions, these appointments should be limited to one new problem per visit. If you experience a new problem, in which you desire to be addressed, please make an acute office visit, we save openings on the schedule to accommodate you. Please do not save  your new problem for any other type of visit, let us take care of it properly and quickly for you.   Follow up visits:  Depending on your condition(s) your provider will need to see you routinely in order to provide you with quality care and prescribe medication(s). Most chronic conditions (Example: hypertension, Diabetes, depression/anxiety... etc), require visits a couple times a year. Your provider will instruct you on proper follow up for your personal medical conditions and history. Please make certain to make follow up appointments for your condition as instructed. Failing to do so could result in lapse in your medication treatment/refills. If you request a refill, and are overdue to be seen on a condition, we will always provide you with a 30 day script (once) to allow you time to schedule.    Medicare wellness (well visit): - we have a wonderful Nurse Maudie Mercury), that will meet with you and provide you will yearly medicare wellness visits. These visits should occur yearly (can not be scheduled less than 1 calendar year apart) and cover preventive health, immunizations, advance directives and screenings you are entitled to yearly through your medicare benefits. Do not miss out on your entitled benefits, this is when medicare will pay for these benefits to be ordered for you.  These are strongly encouraged by your provider and is the appropriate type of visit to make certain you are up to date with all preventive health benefits. If you have not had your medicare wellness exam in the last 12 months, please make certain to schedule one by calling the office and schedule your medicare wellness with Maudie Mercury as soon as possible.   Yearly physical (well visit):  - Adults are recommended to be seen yearly for physicals. Check with your insurance and date of your last physical, most insurances require one calendar year between physicals. Physicals include all preventive health topics, screenings, medical exam and  labs that are appropriate for gender/age and history. You may have fasting labs needed at this visit. This is a well visit (not a sick visit), acute topics should not be covered during this visit.  - Pediatric patients are seen more frequently when they are younger. Your provider will advise you on well child visit timing that is appropriate for your their age. - This is not a medicare wellness visit. Medicare wellness exams do not have an exam portion to the visit. Some medicare companies allow for a physical, some do not allow a yearly physical. If your medicare allows a yearly physical you can schedule the medicare wellness with our nurse Maudie Mercury and have your physical with your provider after, on the same day. Please check with insurance for your full benefits.   Late Policy/No Shows:  - all new patients should arrive 15-30 minutes earlier than appointment to allow [place] time  to  obtain all personal demographics,  insurance information and for you to complete office paperwork. - All established patients should arrive 10-15 minutes earlier  than appointment time to update all information and be checked in .  - In our best efforts to run on time, if you are late for your appointment you will be asked to either reschedule or if able, we will work you back into the schedule. There will be a wait time to work you back in the schedule,  depending on availability.  - If you are unable to make it to your appointment as scheduled, please call 24 hours ahead of time to allow [place] to fill the time slot with someone else who needs to be seen. If you do not cancel your appointment ahead of time, you may be charged a no show fee.

## 2016-04-15 ENCOUNTER — Telehealth: Payer: Self-pay | Admitting: Family Medicine

## 2016-04-15 NOTE — Telephone Encounter (Signed)
LM for patient to CB to find out where she would like to have her US done.

## 2016-04-24 ENCOUNTER — Telehealth: Payer: Self-pay | Admitting: Family Medicine

## 2016-04-24 ENCOUNTER — Ambulatory Visit
Admission: RE | Admit: 2016-04-24 | Discharge: 2016-04-24 | Disposition: A | Discharge status: Home / Self Care | Payer: BLUE CROSS/BLUE SHIELD | Source: Ambulatory Visit | Attending: Family Medicine | Admitting: Family Medicine

## 2016-04-24 DIAGNOSIS — R070 Pain in throat: Secondary | ICD-10-CM

## 2016-04-24 DIAGNOSIS — E041 Nontoxic single thyroid nodule: Secondary | ICD-10-CM

## 2016-04-24 DIAGNOSIS — E063 Autoimmune thyroiditis: Secondary | ICD-10-CM

## 2016-04-24 DIAGNOSIS — E042 Nontoxic multinodular goiter: Secondary | ICD-10-CM | POA: Diagnosis not present

## 2016-04-24 NOTE — Telephone Encounter (Signed)
Please call pt:  Her thyroid US was stable. No changes in the nodules, however still needs to be followed yearly.  - she was asked to f/u if symptoms persisted or did not resolve. I do not see she has follow scheduled. She schedule within 2 weeks if sym[ptoms not improving.

## 2016-04-24 NOTE — Telephone Encounter (Signed)
Scheduled 04/24/16

## 2016-04-27 DIAGNOSIS — Z1159 Encounter for screening for other viral diseases: Secondary | ICD-10-CM | POA: Diagnosis not present

## 2016-04-27 DIAGNOSIS — Z113 Encounter for screening for infections with a predominantly sexual mode of transmission: Secondary | ICD-10-CM | POA: Diagnosis not present

## 2016-04-27 DIAGNOSIS — R1031 Right lower quadrant pain: Secondary | ICD-10-CM | POA: Diagnosis not present

## 2016-04-27 DIAGNOSIS — Z681 Body mass index (BMI) 19 or less, adult: Secondary | ICD-10-CM | POA: Diagnosis not present

## 2016-04-27 DIAGNOSIS — E039 Hypothyroidism, unspecified: Secondary | ICD-10-CM | POA: Diagnosis not present

## 2016-04-27 DIAGNOSIS — Z01419 Encounter for gynecological examination (general) (routine) without abnormal findings: Secondary | ICD-10-CM | POA: Diagnosis not present

## 2016-04-27 DIAGNOSIS — Z114 Encounter for screening for human immunodeficiency virus [HIV]: Secondary | ICD-10-CM | POA: Diagnosis not present

## 2016-04-27 DIAGNOSIS — N939 Abnormal uterine and vaginal bleeding, unspecified: Secondary | ICD-10-CM | POA: Diagnosis not present

## 2016-04-27 NOTE — Telephone Encounter (Signed)
Detailed message left on voice mail. Okay per DPR. 

## 2016-05-06 DIAGNOSIS — M542 Cervicalgia: Secondary | ICD-10-CM | POA: Diagnosis not present

## 2016-05-06 DIAGNOSIS — M9902 Segmental and somatic dysfunction of thoracic region: Secondary | ICD-10-CM | POA: Diagnosis not present

## 2016-05-06 DIAGNOSIS — M546 Pain in thoracic spine: Secondary | ICD-10-CM | POA: Diagnosis not present

## 2016-05-06 DIAGNOSIS — M9901 Segmental and somatic dysfunction of cervical region: Secondary | ICD-10-CM | POA: Diagnosis not present

## 2016-05-13 DIAGNOSIS — M9901 Segmental and somatic dysfunction of cervical region: Secondary | ICD-10-CM | POA: Diagnosis not present

## 2016-05-13 DIAGNOSIS — M542 Cervicalgia: Secondary | ICD-10-CM | POA: Diagnosis not present

## 2016-05-13 DIAGNOSIS — M546 Pain in thoracic spine: Secondary | ICD-10-CM | POA: Diagnosis not present

## 2016-05-13 DIAGNOSIS — M9902 Segmental and somatic dysfunction of thoracic region: Secondary | ICD-10-CM | POA: Diagnosis not present

## 2016-05-21 DIAGNOSIS — Z20828 Contact with and (suspected) exposure to other viral communicable diseases: Secondary | ICD-10-CM | POA: Diagnosis not present

## 2016-05-21 DIAGNOSIS — R52 Pain, unspecified: Secondary | ICD-10-CM | POA: Diagnosis not present

## 2016-05-21 DIAGNOSIS — R69 Illness, unspecified: Secondary | ICD-10-CM | POA: Diagnosis not present

## 2016-06-03 DIAGNOSIS — M546 Pain in thoracic spine: Secondary | ICD-10-CM | POA: Diagnosis not present

## 2016-06-03 DIAGNOSIS — M9902 Segmental and somatic dysfunction of thoracic region: Secondary | ICD-10-CM | POA: Diagnosis not present

## 2016-06-03 DIAGNOSIS — M542 Cervicalgia: Secondary | ICD-10-CM | POA: Diagnosis not present

## 2016-06-03 DIAGNOSIS — M9901 Segmental and somatic dysfunction of cervical region: Secondary | ICD-10-CM | POA: Diagnosis not present

## 2016-06-23 DIAGNOSIS — M546 Pain in thoracic spine: Secondary | ICD-10-CM | POA: Diagnosis not present

## 2016-06-23 DIAGNOSIS — M542 Cervicalgia: Secondary | ICD-10-CM | POA: Diagnosis not present

## 2016-06-23 DIAGNOSIS — M9901 Segmental and somatic dysfunction of cervical region: Secondary | ICD-10-CM | POA: Diagnosis not present

## 2016-06-23 DIAGNOSIS — M9902 Segmental and somatic dysfunction of thoracic region: Secondary | ICD-10-CM | POA: Diagnosis not present

## 2016-07-07 DIAGNOSIS — M542 Cervicalgia: Secondary | ICD-10-CM | POA: Diagnosis not present

## 2016-07-07 DIAGNOSIS — M9901 Segmental and somatic dysfunction of cervical region: Secondary | ICD-10-CM | POA: Diagnosis not present

## 2016-07-07 DIAGNOSIS — M9902 Segmental and somatic dysfunction of thoracic region: Secondary | ICD-10-CM | POA: Diagnosis not present

## 2016-07-07 DIAGNOSIS — M546 Pain in thoracic spine: Secondary | ICD-10-CM | POA: Diagnosis not present

## 2016-08-03 ENCOUNTER — Other Ambulatory Visit: Payer: Self-pay

## 2016-08-03 MED ORDER — PANTOPRAZOLE SODIUM 40 MG PO TBEC: 40.0000 mg | DELAYED_RELEASE_TABLET | Freq: Every day | ORAL | 0 refills | Status: DC

## 2016-08-07 DIAGNOSIS — M9902 Segmental and somatic dysfunction of thoracic region: Secondary | ICD-10-CM | POA: Diagnosis not present

## 2016-08-07 DIAGNOSIS — M9901 Segmental and somatic dysfunction of cervical region: Secondary | ICD-10-CM | POA: Diagnosis not present

## 2016-08-07 DIAGNOSIS — M546 Pain in thoracic spine: Secondary | ICD-10-CM | POA: Diagnosis not present

## 2016-08-07 DIAGNOSIS — M542 Cervicalgia: Secondary | ICD-10-CM | POA: Diagnosis not present

## 2016-08-10 ENCOUNTER — Encounter: Payer: Self-pay | Admitting: Sports Medicine

## 2016-08-10 ENCOUNTER — Ambulatory Visit (INDEPENDENT_AMBULATORY_CARE_PROVIDER_SITE_OTHER): Payer: BLUE CROSS/BLUE SHIELD | Admitting: Sports Medicine

## 2016-08-10 ENCOUNTER — Ambulatory Visit (INDEPENDENT_AMBULATORY_CARE_PROVIDER_SITE_OTHER): Payer: BLUE CROSS/BLUE SHIELD

## 2016-08-10 DIAGNOSIS — M76829 Posterior tibial tendinitis, unspecified leg: Secondary | ICD-10-CM

## 2016-08-10 DIAGNOSIS — M25571 Pain in right ankle and joints of right foot: Secondary | ICD-10-CM

## 2016-08-10 DIAGNOSIS — M79671 Pain in right foot: Secondary | ICD-10-CM | POA: Diagnosis not present

## 2016-08-10 DIAGNOSIS — M25572 Pain in left ankle and joints of left foot: Secondary | ICD-10-CM | POA: Diagnosis not present

## 2016-08-10 DIAGNOSIS — M79672 Pain in left foot: Secondary | ICD-10-CM

## 2016-08-10 MED ORDER — MELOXICAM 15 MG PO TABS: ORAL_TABLET | ORAL | 3 refills | Status: DC

## 2016-08-10 NOTE — Progress Notes (Signed)

## 2016-08-10 NOTE — Assessment & Plan Note (Signed)
Right worse than left. Custom orthotics as above for pes cavus. 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 Meloxicam for pain. Return in one month.

## 2016-08-17 ENCOUNTER — Encounter: Payer: Self-pay | Admitting: Physical Therapy

## 2016-08-17 ENCOUNTER — Ambulatory Visit (INDEPENDENT_AMBULATORY_CARE_PROVIDER_SITE_OTHER): Payer: BLUE CROSS/BLUE SHIELD | Admitting: Physical Therapy

## 2016-08-17 DIAGNOSIS — M6281 Muscle weakness (generalized): Secondary | ICD-10-CM | POA: Diagnosis not present

## 2016-08-17 DIAGNOSIS — M25571 Pain in right ankle and joints of right foot: Secondary | ICD-10-CM | POA: Diagnosis not present

## 2016-08-17 DIAGNOSIS — M25572 Pain in left ankle and joints of left foot: Secondary | ICD-10-CM

## 2016-08-17 NOTE — Therapy (Signed)
[place], [place], [address] Phone: [phone]   Fax:  [phone]  Physical Therapy Evaluation  Patient Details  Name: Meghan Hayes MRN: [id number] Date of Birth: 09-16-1990 Referring Provider: Dr Dianah Field  Encounter Date: 08/17/2016      PT End of Session - 08/17/16 1151    Visit Number 1   Number of Visits 4   Date for PT Re-Evaluation 09/14/16   PT Start Time 3570   PT Stop Time 1234   PT Time Calculation (min) 43 min   Activity Tolerance Patient limited by fatigue      Past Medical History:  Diagnosis Date  . Allergy    seasonal, nuts, wheat, corn, soy  . Gallbladder polyp 2011   6 mm has had repeat imaging and stable.   Marland Kitchen GERD (gastroesophageal reflux disease)   . Hashimoto's disease 2014  . Ileocecal valve syndrome 2017   followed by dr. Havery Moros  . Migraines   . Thyroid nodule 2014   last [place] 2016: 1.2cm thyroid nodule vs PTH adenoma.     Past Surgical History:  Procedure Laterality Date  . TONSILLECTOMY  2008    There were no vitals filed for this visit.       Subjective Assessment - 08/17/16 1151    Subjective [name] reports that both her ankles have really started to bother her in the last two months.  She has had orthotics in the past however they were not helping.  Currently she is not exercising.  Has custom orthotics made last week and she has a different kind of pain now.  She is hypermobilie in all her joints and want to learn safe ways to exercise.   How long can you sit comfortably? 1-2 hrs   How long can you stand comfortably? 20-30 minutes   How long can you walk comfortably? 20-30 minutes   Patient Stated Goals learn how to exercise safely for her body. She is looking for 30-45 min of exercise, 4-5 times a week   Currently in Pain? Yes   Pain Score 4    Pain Location --  pt reports she  "exists in pain"  all over   Pain Descriptors / Indicators Dull    Pain Type Chronic pain   Pain Onset More than a month ago   Pain Frequency Constant   Aggravating Factors  stand   Pain Relieving Factors rest            [facility] PT Assessment - 08/17/16 0001      Assessment   Medical Diagnosis Bilat post tib tenodonitis   Referring Provider Dr Dianah Field   Onset Date/Surgical Date 06/17/16   Hand Dominance Right   Next MD Visit 09/09/16   Prior Therapy shoulder 10 yrs ago     Precautions   Precautions --  no stretching     Balance Screen   Has the patient fallen in the past 6 months No     Prior Function   Level of Independence Independent   Vocation Full time employment   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    Leisure doesn't really do anything active, would like to get into dance     Observation/Other Assessments   Focus on Therapeutic Outcomes (FOTO)  51% limited     Functional Tests   Functional tests Squat;Single Leg Squat;Single leg stance;Lunges     Squat   Comments shift to Lt, LE  ER'ed, FWD , LE adduction     Lunges   Comments slight knee adduction bilat     Single Leg Stance   Comments WNL bilat     Posture/Postural Control   Posture/Postural Control Postural limitations   Postural Limitations Rounded Shoulders;Forward head;Flexed trunk     ROM / Strength   AROM / PROM / Strength Strength;PROM;AROM     AROM   AROM Assessment Site Ankle   Right/Left Ankle Right;Left   Right Ankle Dorsiflexion -1   Right Ankle Inversion 16   Left Ankle Dorsiflexion -2   Left Ankle Inversion 27     PROM   PROM Assessment Site Ankle   Right/Left Ankle Right;Left   Right Ankle Dorsiflexion 12   Left Ankle Dorsiflexion 7   Left Ankle Eversion --  WNL     Strength   Strength Assessment Site Hip;Knee;Ankle;Lumbar   Right/Left Hip Right;Left   Right Hip Flexion 5/5   Right Hip Extension 4+/5   Right Hip ABduction 5/5   Left Hip Flexion 5/5   Left Hip Extension 4+/5   Left Hip ABduction 4+/5    Right/Left Knee --  bilat WNL   Right/Left Ankle Right;Left   Right Ankle Dorsiflexion 5/5   Right Ankle Plantar Flexion 5/5   Right Ankle Inversion 4/5   Right Ankle Eversion 4/5   Left Ankle Dorsiflexion 5/5   Left Ankle Plantar Flexion 5/5   Left Ankle Inversion 4/5   Left Ankle Eversion 4+/5   Lumbar Flexion --  transverse abdominis good (-)    Lumbar Extension --  multifidi fair (-)      Flexibility   Soft Tissue Assessment /Muscle Length --  bilat WNL            Objective measurements completed on examination: See above findings.          [facility] Adult PT Treatment/Exercise - 08/17/16 0001      Exercises   Exercises Knee/Hip     Knee/Hip Exercises: Stretches   Gastroc Stretch Both;1 rep;30 seconds     Knee/Hip Exercises: Standing   SLS x10 each side, VC for form     Knee/Hip Exercises: Supine   Other Supine Knee/Hip Exercises prone on elbows planks, supermans with UE W's to Y's     Knee/Hip Exercises: Sidelying   Other Sidelying Knee/Hip Exercises side planks                     PT Long Term Goals - 08/17/16 1148      PT LONG TERM GOAL #1   Title I with advanced HEP to include a gym routine( 09/14/16)   Time 4   Period Weeks   Status New     PT LONG TERM GOAL #2   Title report =/> 50% reduction in body pain with daily acitivity ( 09/14/16)   Time 4   Period Weeks   Status New     PT LONG TERM GOAL #3   Title improve FOTO =/< 36% limited ( 09/14/16)   Time 4   Period Weeks   Status New     PT LONG TERM GOAL #4   Title improve bilat ankle dorsiflexion =/> 12 degrees ( 09/14/16)    Time 4   Period Weeks   Status New     PT LONG TERM GOAL #5   Title tolerate some beginnger hip/hop/zumba type movements (09/14/16)    Time 4   Period Weeks   Status  New                Plan - 08/17/16 1250    Clinical Impression Statement 26 yo female referred for bilat post tib tendonitis, 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 Her biggest concern is staying injury free as she is hypermobile. She tests well for strength in her LE's with the break test, has weak core muscles and this presents when she attempts standing movements. She is tight in bilat ankle dorsiflexion and has weakness in her ankles.    Clinical Presentation Evolving   Clinical Decision Making Low   Rehab Potential Good   PT Frequency 1x / week   PT Duration 4 weeks   PT Treatment/Interventions Neuromuscular re-education;Cryotherapy;Electrical Stimulation;Patient/family education;Therapeutic exercise   PT Next Visit Plan Pt requests 1-2 visits to learn what she can do on her own.    Consulted and Agree with Plan of Care Patient      Patient will benefit from skilled therapeutic intervention in order to improve the following deficits and impairments:  Decreased range of motion, Pain, Decreased strength, Hypermobility  Visit Diagnosis: Pain in right ankle and joints of right foot - Plan: PT plan of care cert/re-cert  Pain in left ankle and joints of left foot - Plan: PT plan of care cert/re-cert  Muscle weakness (generalized) - Plan: PT plan of care cert/re-cert     Problem List Patient Active Problem List   Diagnosis Date Noted  . 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 Vitamin D deficiency 04/14/2016  . Throat pain 04/14/2016  . Thyroid nodule 04/14/2016  . GERD (gastroesophageal reflux disease)   . Hashimoto's thyroiditis 07/26/2012    Jeral Pinch PT  08/17/2016, 1:02 PM  [facility] [place], [place], [address] Phone: [phone]   Fax:  [phone]  Name: Keslie Gritz MRN: [id number] Date of Birth: 16-Nov-1990

## 2016-08-24 DIAGNOSIS — R1031 Right lower quadrant pain: Secondary | ICD-10-CM | POA: Diagnosis not present

## 2016-08-31 ENCOUNTER — Ambulatory Visit (INDEPENDENT_AMBULATORY_CARE_PROVIDER_SITE_OTHER): Payer: BLUE CROSS/BLUE SHIELD | Admitting: Physical Therapy

## 2016-08-31 DIAGNOSIS — M6281 Muscle weakness (generalized): Secondary | ICD-10-CM | POA: Diagnosis not present

## 2016-08-31 DIAGNOSIS — M25572 Pain in left ankle and joints of left foot: Secondary | ICD-10-CM

## 2016-08-31 DIAGNOSIS — M25571 Pain in right ankle and joints of right foot: Secondary | ICD-10-CM

## 2016-08-31 NOTE — Patient Instructions (Signed)
General guidelines for working out:  Always warm up, cardio equipment for 10-15 minutes  ( if goal is to improve endurance or work cardiovascular system increase time)   Monitor heart rate, count pulse for 30 sec and double it, stay between 117-156 for your age.  Lifting weights, if goal is to tone, use medium weight, more reps.  To build muscle, use heavy weight and low reps.

## 2016-08-31 NOTE — Therapy (Addendum)
[place], [place], [address] Phone: [phone]   Fax:  [phone]  Physical Therapy Treatment  Patient Details  Name: Meghan Hayes MRN: [id number] Date of Birth: Jul 09, 1990 Referring Provider: Dr Dianah Field  Encounter Date: 08/31/2016      PT End of Session - 08/31/16 1150    Visit Number 2   Number of Visits 4   Date for PT Re-Evaluation 09/14/16   PT Start Time 1150   PT Stop Time 1233   PT Time Calculation (min) 43 min   Activity Tolerance Patient tolerated treatment well      Past Medical History:  Diagnosis Date  . Allergy    seasonal, nuts, wheat, corn, soy  . Gallbladder polyp 2011   6 mm has had repeat imaging and stable.   Marland Kitchen GERD (gastroesophageal reflux disease)   . Hashimoto's disease 2014  . Ileocecal valve syndrome 2017   followed by dr. Havery Moros  . Migraines   . Thyroid nodule 2014   last [place] 2016: 1.2cm thyroid nodule vs PTH adenoma.     Past Surgical History:  Procedure Laterality Date  . TONSILLECTOMY  2008    There were no vitals filed for this visit.      Subjective Assessment - 08/31/16 1150    Subjective Dawne reports that she is having trouble with the FWD leans on the Lt side.     Patient Stated Goals learn how to exercise safely for her body. She is looking for 30-45 min of exercise, 4-5 times a week   Currently in Pain? Yes  feels more sore with the exercise, mostly in hamstrings and buttocks.             [facility] PT Assessment - 08/31/16 0001      Assessment   Medical Diagnosis Bilat post tib tenodonitis   Referring Provider Dr Dianah Field   Onset Date/Surgical Date 06/17/16   Hand Dominance Right   Next MD Visit 09/09/16     AROM   AROM Assessment Site Ankle   Right/Left Ankle Left;Right   Right Ankle Dorsiflexion 15   Left Ankle Dorsiflexion 15                     [facility] Adult PT Treatment/Exercise - 08/31/16 0001      Exercises   Exercises Other Exercises   Other Exercises  20 reps biceps/triceps curls 20 reps, lat pull down 20 reps, 1 plate. 10reps quadraped alt arm leg lifts      Knee/Hip Exercises: Stretches   Press photographer Both;2 reps;30 seconds  prostretch     Knee/Hip Exercises: Machines for Strengthening   Cybex Knee Extension 20 reps, 1 plate   Cybex Knee Flexion --  verbally reviewed    Cybex Leg Press 20 reps, 5 plates     Knee/Hip Exercises: Standing   Heel Raises Both;3 sets  8 reps alt toes in/out/straight     Knee/Hip Exercises: Seated   Hamstring Limitations 20 reps dorsiflexion with red band each side.      Knee/Hip Exercises: Supine   Other Supine Knee/Hip Exercises pilates table tops with knee extension, knee pulls, 3x5 reps                     PT Long Term Goals - 08/31/16 1224      PT LONG TERM GOAL #1   Title I with advanced HEP to include a gym routine( 09/14/16)  Status On-going     PT LONG TERM GOAL #2   Title report =/> 50% reduction in body pain with daily acitivity ( 09/14/16)   Status On-going     PT LONG TERM GOAL #3   Title improve FOTO =/< 36% limited ( 09/14/16)   Status On-going     PT LONG TERM GOAL #4   Title improve bilat ankle dorsiflexion =/> 12 degrees ( 09/14/16)    Status Achieved     PT LONG TERM GOAL #5   Title tolerate some beginnger hip/hop/zumba type movements (09/14/16)    Status On-going               Plan - 08/31/16 1241    Clinical Impression Statement This is Ayonna's second visit, she has met a goal and making progress to the others.  She fatiuges quickly with all exericse and has limited endurance.  She continues with general body pain however currently it isn't interfering with her function.      Rehab Potential Good   PT Frequency 1x / week   PT Duration 4 weeks   PT Treatment/Interventions Neuromuscular re-education;Cryotherapy;Electrical Stimulation;Patient/family education;Therapeutic exercise   PT  Next Visit Plan assess tolerance to gym equipment exercise and add in other core options.    Consulted and Agree with Plan of Care Patient      Patient will benefit from skilled therapeutic intervention in order to improve the following deficits and impairments:  Decreased range of motion, Pain, Decreased strength, Hypermobility  Visit Diagnosis: Pain in right ankle and joints of right foot  Pain in left ankle and joints of left foot  Muscle weakness (generalized)     Problem List Patient Active Problem List   Diagnosis Date Noted  . 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 Vitamin D deficiency 04/14/2016  . Throat pain 04/14/2016  . Thyroid nodule 04/14/2016  . GERD (gastroesophageal reflux disease)   . Hashimoto's thyroiditis 07/26/2012    Jeral Pinch PT  08/31/2016, 12:44 PM  [facility] [place], [place], [address] Phone: [phone]   Fax:  [phone]  Name: Meghan Hayes MRN: [id number] Date of Birth: 11/17/1990   PHYSICAL THERAPY DISCHARGE SUMMARY  Visits from Start of Care: 2  Current functional level related to goals / functional outcomes: unknown   Remaining deficits: unknown   Education / Equipment: HEP Plan:                                                    Patient goals were partially met. Patient is being discharged due to not returning since the last visit.  ?????    Jeral Pinch, PT 10/06/16 10:58 AM

## 2016-09-07 ENCOUNTER — Ambulatory Visit: Payer: BLUE CROSS/BLUE SHIELD | Admitting: Sports Medicine

## 2016-09-21 ENCOUNTER — Ambulatory Visit: Payer: BLUE CROSS/BLUE SHIELD | Admitting: Sports Medicine

## 2016-09-21 DIAGNOSIS — Z0189 Encounter for other specified special examinations: Secondary | ICD-10-CM

## 2016-09-23 DIAGNOSIS — M542 Cervicalgia: Secondary | ICD-10-CM | POA: Diagnosis not present

## 2016-09-23 DIAGNOSIS — M546 Pain in thoracic spine: Secondary | ICD-10-CM | POA: Diagnosis not present

## 2016-09-23 DIAGNOSIS — M9902 Segmental and somatic dysfunction of thoracic region: Secondary | ICD-10-CM | POA: Diagnosis not present

## 2016-09-23 DIAGNOSIS — M9901 Segmental and somatic dysfunction of cervical region: Secondary | ICD-10-CM | POA: Diagnosis not present

## 2016-09-28 ENCOUNTER — Ambulatory Visit (INDEPENDENT_AMBULATORY_CARE_PROVIDER_SITE_OTHER): Payer: BLUE CROSS/BLUE SHIELD | Admitting: Sports Medicine

## 2016-09-28 DIAGNOSIS — M76829 Posterior tibial tendinitis, unspecified leg: Secondary | ICD-10-CM | POA: Diagnosis not present

## 2016-09-28 MED ORDER — PANTOPRAZOLE SODIUM 40 MG PO TBEC: 40.0000 mg | DELAYED_RELEASE_TABLET | Freq: Every day | ORAL | 0 refills | Status: DC

## 2016-09-28 NOTE — Assessment & Plan Note (Signed)
50% improvement doing well with physical therapy. Custom orthotics are in good shape. She didn't take meloxicam, she is going to start this, I'm going to refill her pantoprazole. 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 difficile colitis and hypomagnesemia as well as osteoporosis.  At this point I think the benefits of taking pantoprazole outweigh the risks. She should continue her exercises and return to see me in one month.

## 2016-09-28 NOTE — Progress Notes (Signed)
  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  She has not yet taken her meloxicam.  Past medical history:  Negative.  See flowsheet/record as well for more information.  Surgical history: Negative.  See flowsheet/record as well for more information.  Family history: Negative.  See flowsheet/record as well for more information.  Social history: Negative.  See flowsheet/record as well for more information.  Allergies, and medications have been entered into the medical record, reviewed, and no changes needed.   Review of Systems: No fevers, chills, night sweats, weight loss, chest pain, or shortness of breath.   Objective:    General: Well Developed, well nourished, and in no acute distress.  Neuro: Alert and oriented x3, extra-ocular muscles intact, sensation grossly intact.  HEENT: Normocephalic, atraumatic, pupils equal round reactive to light, neck supple, no masses, no lymphadenopathy, thyroid nonpalpable.  Skin: Warm and dry, no rashes. Cardiac: Regular rate and rhythm, no murmurs rubs or gallops, no lower extremity edema.  Respiratory: Clear to auscultation bilaterally. Not using accessory muscles, speaking in full sentences. Bilateral ankles: No visible erythema or swelling. Range of motion is full in all directions. Strength is 5/5 in all directions. Stable lateral and medial ligaments; squeeze test and kleiger test unremarkable; Talar dome nontender; No pain at base of 5th MT; No tenderness over cuboid; No tenderness over N spot or navicular prominence No tenderness on posterior aspects of lateral and medial malleolus, with aggressive resisted  eversion I could reproduce some peroneal type pain but this is really a soft call. No sign of peroneal tendon subluxations; Negative tarsal tunnel tinel's Able to walk 4 steps.  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 Custom orthotics are in good shape. She didn't take meloxicam, she is going to start this, I'm going to refill her pantoprazole. 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 difficile colitis and hypomagnesemia as well as osteoporosis.  At this point I think the benefits of taking pantoprazole outweigh the risks. She should continue her exercises and return to see me in one month.  I spent 25 minutes with this patient, greater than 50% was face-to-face time counseling regarding the above diagnoses

## 2016-10-05 DIAGNOSIS — M9901 Segmental and somatic dysfunction of cervical region: Secondary | ICD-10-CM | POA: Diagnosis not present

## 2016-10-05 DIAGNOSIS — M542 Cervicalgia: Secondary | ICD-10-CM | POA: Diagnosis not present

## 2016-10-05 DIAGNOSIS — M9902 Segmental and somatic dysfunction of thoracic region: Secondary | ICD-10-CM | POA: Diagnosis not present

## 2016-10-05 DIAGNOSIS — M546 Pain in thoracic spine: Secondary | ICD-10-CM | POA: Diagnosis not present

## 2016-10-19 DIAGNOSIS — M542 Cervicalgia: Secondary | ICD-10-CM | POA: Diagnosis not present

## 2016-10-19 DIAGNOSIS — M9901 Segmental and somatic dysfunction of cervical region: Secondary | ICD-10-CM | POA: Diagnosis not present

## 2016-10-19 DIAGNOSIS — M9902 Segmental and somatic dysfunction of thoracic region: Secondary | ICD-10-CM | POA: Diagnosis not present

## 2016-10-19 DIAGNOSIS — M546 Pain in thoracic spine: Secondary | ICD-10-CM | POA: Diagnosis not present

## 2016-11-02 ENCOUNTER — Ambulatory Visit: Payer: BLUE CROSS/BLUE SHIELD | Admitting: Sports Medicine

## 2016-11-09 DIAGNOSIS — M9901 Segmental and somatic dysfunction of cervical region: Secondary | ICD-10-CM | POA: Diagnosis not present

## 2016-11-09 DIAGNOSIS — M546 Pain in thoracic spine: Secondary | ICD-10-CM | POA: Diagnosis not present

## 2016-11-09 DIAGNOSIS — M9902 Segmental and somatic dysfunction of thoracic region: Secondary | ICD-10-CM | POA: Diagnosis not present

## 2016-11-09 DIAGNOSIS — M542 Cervicalgia: Secondary | ICD-10-CM | POA: Diagnosis not present

## 2016-11-16 ENCOUNTER — Ambulatory Visit: Payer: BLUE CROSS/BLUE SHIELD | Admitting: Sports Medicine

## 2016-12-07 DIAGNOSIS — M546 Pain in thoracic spine: Secondary | ICD-10-CM | POA: Diagnosis not present

## 2016-12-07 DIAGNOSIS — M542 Cervicalgia: Secondary | ICD-10-CM | POA: Diagnosis not present

## 2016-12-07 DIAGNOSIS — M9902 Segmental and somatic dysfunction of thoracic region: Secondary | ICD-10-CM | POA: Diagnosis not present

## 2016-12-07 DIAGNOSIS — M9901 Segmental and somatic dysfunction of cervical region: Secondary | ICD-10-CM | POA: Diagnosis not present

## 2016-12-20 ENCOUNTER — Other Ambulatory Visit: Payer: Self-pay | Admitting: Sports Medicine

## 2016-12-21 DIAGNOSIS — E559 Vitamin D deficiency, unspecified: Secondary | ICD-10-CM | POA: Diagnosis not present

## 2016-12-21 DIAGNOSIS — M791 Myalgia, unspecified site: Secondary | ICD-10-CM | POA: Diagnosis not present

## 2016-12-21 DIAGNOSIS — E063 Autoimmune thyroiditis: Secondary | ICD-10-CM | POA: Diagnosis not present

## 2016-12-21 DIAGNOSIS — N943 Premenstrual tension syndrome: Secondary | ICD-10-CM | POA: Diagnosis not present

## 2016-12-21 DIAGNOSIS — M2550 Pain in unspecified joint: Secondary | ICD-10-CM | POA: Diagnosis not present

## 2016-12-21 DIAGNOSIS — K219 Gastro-esophageal reflux disease without esophagitis: Secondary | ICD-10-CM | POA: Diagnosis not present

## 2016-12-21 DIAGNOSIS — G43009 Migraine without aura, not intractable, without status migrainosus: Secondary | ICD-10-CM | POA: Diagnosis not present

## 2016-12-21 DIAGNOSIS — E569 Vitamin deficiency, unspecified: Secondary | ICD-10-CM | POA: Diagnosis not present

## 2017-01-08 DIAGNOSIS — M9901 Segmental and somatic dysfunction of cervical region: Secondary | ICD-10-CM | POA: Diagnosis not present

## 2017-01-08 DIAGNOSIS — M9902 Segmental and somatic dysfunction of thoracic region: Secondary | ICD-10-CM | POA: Diagnosis not present

## 2017-01-08 DIAGNOSIS — M546 Pain in thoracic spine: Secondary | ICD-10-CM | POA: Diagnosis not present

## 2017-01-08 DIAGNOSIS — M542 Cervicalgia: Secondary | ICD-10-CM | POA: Diagnosis not present

## 2017-01-11 DIAGNOSIS — B1081 Human herpesvirus 6 infection: Secondary | ICD-10-CM | POA: Diagnosis not present

## 2017-01-11 DIAGNOSIS — E063 Autoimmune thyroiditis: Secondary | ICD-10-CM | POA: Diagnosis not present

## 2017-01-11 DIAGNOSIS — E018 Other iodine-deficiency related thyroid disorders and allied conditions: Secondary | ICD-10-CM | POA: Diagnosis not present

## 2017-01-11 DIAGNOSIS — R7989 Other specified abnormal findings of blood chemistry: Secondary | ICD-10-CM | POA: Diagnosis not present

## 2017-01-18 DIAGNOSIS — K219 Gastro-esophageal reflux disease without esophagitis: Secondary | ICD-10-CM | POA: Diagnosis not present

## 2017-02-04 DIAGNOSIS — M9902 Segmental and somatic dysfunction of thoracic region: Secondary | ICD-10-CM | POA: Diagnosis not present

## 2017-02-04 DIAGNOSIS — M546 Pain in thoracic spine: Secondary | ICD-10-CM | POA: Diagnosis not present

## 2017-02-04 DIAGNOSIS — M9901 Segmental and somatic dysfunction of cervical region: Secondary | ICD-10-CM | POA: Diagnosis not present

## 2017-02-04 DIAGNOSIS — M542 Cervicalgia: Secondary | ICD-10-CM | POA: Diagnosis not present

## 2017-03-12 DIAGNOSIS — M542 Cervicalgia: Secondary | ICD-10-CM | POA: Diagnosis not present

## 2017-03-12 DIAGNOSIS — M9901 Segmental and somatic dysfunction of cervical region: Secondary | ICD-10-CM | POA: Diagnosis not present

## 2017-03-12 DIAGNOSIS — M9902 Segmental and somatic dysfunction of thoracic region: Secondary | ICD-10-CM | POA: Diagnosis not present

## 2017-03-12 DIAGNOSIS — M546 Pain in thoracic spine: Secondary | ICD-10-CM | POA: Diagnosis not present

## 2017-04-12 DIAGNOSIS — M546 Pain in thoracic spine: Secondary | ICD-10-CM | POA: Diagnosis not present

## 2017-04-12 DIAGNOSIS — M9902 Segmental and somatic dysfunction of thoracic region: Secondary | ICD-10-CM | POA: Diagnosis not present

## 2017-04-12 DIAGNOSIS — M9901 Segmental and somatic dysfunction of cervical region: Secondary | ICD-10-CM | POA: Diagnosis not present

## 2017-04-12 DIAGNOSIS — M542 Cervicalgia: Secondary | ICD-10-CM | POA: Diagnosis not present

## 2017-04-26 DIAGNOSIS — M542 Cervicalgia: Secondary | ICD-10-CM | POA: Diagnosis not present

## 2017-04-26 DIAGNOSIS — M546 Pain in thoracic spine: Secondary | ICD-10-CM | POA: Diagnosis not present

## 2017-04-26 DIAGNOSIS — M9901 Segmental and somatic dysfunction of cervical region: Secondary | ICD-10-CM | POA: Diagnosis not present

## 2017-04-26 DIAGNOSIS — M9902 Segmental and somatic dysfunction of thoracic region: Secondary | ICD-10-CM | POA: Diagnosis not present

## 2017-05-14 DIAGNOSIS — M542 Cervicalgia: Secondary | ICD-10-CM | POA: Diagnosis not present

## 2017-05-14 DIAGNOSIS — M9901 Segmental and somatic dysfunction of cervical region: Secondary | ICD-10-CM | POA: Diagnosis not present

## 2017-05-14 DIAGNOSIS — M9902 Segmental and somatic dysfunction of thoracic region: Secondary | ICD-10-CM | POA: Diagnosis not present

## 2017-05-14 DIAGNOSIS — M546 Pain in thoracic spine: Secondary | ICD-10-CM | POA: Diagnosis not present

## 2017-06-07 DIAGNOSIS — M546 Pain in thoracic spine: Secondary | ICD-10-CM | POA: Diagnosis not present

## 2017-06-07 DIAGNOSIS — M542 Cervicalgia: Secondary | ICD-10-CM | POA: Diagnosis not present

## 2017-06-07 DIAGNOSIS — M9901 Segmental and somatic dysfunction of cervical region: Secondary | ICD-10-CM | POA: Diagnosis not present

## 2017-06-07 DIAGNOSIS — M9902 Segmental and somatic dysfunction of thoracic region: Secondary | ICD-10-CM | POA: Diagnosis not present

## 2017-06-21 DIAGNOSIS — M9902 Segmental and somatic dysfunction of thoracic region: Secondary | ICD-10-CM | POA: Diagnosis not present

## 2017-06-21 DIAGNOSIS — M9901 Segmental and somatic dysfunction of cervical region: Secondary | ICD-10-CM | POA: Diagnosis not present

## 2017-06-21 DIAGNOSIS — M542 Cervicalgia: Secondary | ICD-10-CM | POA: Diagnosis not present

## 2017-06-21 DIAGNOSIS — M546 Pain in thoracic spine: Secondary | ICD-10-CM | POA: Diagnosis not present

## 2017-07-01 DIAGNOSIS — M542 Cervicalgia: Secondary | ICD-10-CM | POA: Diagnosis not present

## 2017-07-01 DIAGNOSIS — M9902 Segmental and somatic dysfunction of thoracic region: Secondary | ICD-10-CM | POA: Diagnosis not present

## 2017-07-01 DIAGNOSIS — M9901 Segmental and somatic dysfunction of cervical region: Secondary | ICD-10-CM | POA: Diagnosis not present

## 2017-07-01 DIAGNOSIS — M546 Pain in thoracic spine: Secondary | ICD-10-CM | POA: Diagnosis not present

## 2017-07-06 ENCOUNTER — Telehealth: Payer: Self-pay | Admitting: Sports Medicine

## 2017-07-06 ENCOUNTER — Ambulatory Visit (INDEPENDENT_AMBULATORY_CARE_PROVIDER_SITE_OTHER): Payer: BLUE CROSS/BLUE SHIELD

## 2017-07-06 ENCOUNTER — Encounter: Payer: Self-pay | Admitting: Sports Medicine

## 2017-07-06 ENCOUNTER — Ambulatory Visit: Payer: BLUE CROSS/BLUE SHIELD | Admitting: Sports Medicine

## 2017-07-06 DIAGNOSIS — M25562 Pain in left knee: Secondary | ICD-10-CM

## 2017-07-06 DIAGNOSIS — M222X2 Patellofemoral disorders, left knee: Secondary | ICD-10-CM

## 2017-07-06 DIAGNOSIS — M25561 Pain in right knee: Secondary | ICD-10-CM | POA: Diagnosis not present

## 2017-07-06 MED ORDER — CELECOXIB 200 MG PO CAPS: ORAL_CAPSULE | ORAL | 2 refills | Status: DC

## 2017-07-06 NOTE — Patient Instructions (Signed)
Patellofemoral Pain Syndrome Patellofemoral pain syndrome is a condition that involves a softening or breakdown of the tissue (cartilage) on the underside of your kneecap (patella). This causes pain in the front of the knee. The condition is also called runner's knee or chondromalacia patella. Patellofemoral pain syndrome is most common in young adults who are active in sports. Your knee is the largest joint in your body. The patella covers the front of your knee and is attached to muscles above and below your knee. The underside of the patella is covered with a smooth type of cartilage (synovium). The smooth surface helps the patella glide easily when you move your knee. Patellofemoral pain syndrome causes swelling in the joint linings and bone surfaces in your knee. What are the causes? Patellofemoral pain syndrome can be caused by:  Overuse.  Poor alignment of your knee joints.  Weak leg muscles.  A direct blow to your kneecap.  What increases the risk? You may be at risk for patellofemoral pain syndrome if you:  Do a lot of activities that can wear down your kneecap. These include: ? Running. ? Squatting. ? Climbing stairs.  Start a new physical activity or exercise program.  Wear shoes that do not fit well.  Do not have good leg strength.  Are overweight.  What are the signs or symptoms? Knee pain is the most common symptom of patellofemoral pain syndrome. This may feel like a dull, aching pain underneath your patella, in the front of your knee. There may be a popping or cracking sound when you move your knee. Pain may get worse with:  Exercise.  Climbing stairs.  Running.  Jumping.  Squatting.  Kneeling.  Sitting for a long time.  Moving or pushing on your patella.  How is this diagnosed? Your health care provider may be able to diagnose patellofemoral pain syndrome from your symptoms and medical history. You may be asked about your recent physical activities  and which ones cause knee pain. Your health care provider may do a physical exam with certain tests to confirm the diagnosis. These may include:  Moving your patella back and forth.  Checking your range of knee motion.  Having you squat or jump to see if you have pain.  Checking the strength of your leg muscles.  An MRI of the knee may also be done. How is this treated? Patellofemoral pain syndrome can usually be treated at home with rest, ice, compression, and elevation (RICE). Other treatments may include:  Nonsteroidal anti-inflammatory drugs (NSAIDs).  Physical therapy to stretch and strengthen your leg muscles.  Shoe inserts (orthotics) to take stress off your knee.  A knee brace or knee support.  Surgery to remove damaged cartilage or move the patella to a better position. The need for surgery is rare.  Follow these instructions at home:  Take medicines only as directed by your health care provider.  Rest your knee. ? When resting, keep your knee raised above the level of your heart. ? Avoid activities that cause knee pain.  Apply ice to the injured area: ? Put ice in a plastic bag. ? Place a towel between your skin and the bag. ? Leave the ice on for 20 minutes, 2-3 times a day.  Use splints, braces, knee supports, or walking aids as directed by your health care provider.  Perform stretching and strengthening exercises as directed by your health care provider or physical therapist.  Keep all follow-up visits as directed by your health care   provider. This is important. Contact a health care provider if:  Your symptoms get worse.  You are not improving with home care. This information is not intended to replace advice given to you by your health care provider. Make sure you discuss any questions you have with your health care provider. Document Released: 01/28/2009 Document Revised: 07/18/2015 Document Reviewed: 05/01/2013 Elsevier Interactive Patient Education   2018 Elsevier Inc.  

## 2017-07-06 NOTE — Assessment & Plan Note (Signed)
Patellofemoral syndrome, Celebrex(gastritis with standard NSAIDs), x-rays, reaction knee brace. Rehab exercise, out of work until next Tuesday. Return to see me in 2 weeks.

## 2017-07-06 NOTE — Progress Notes (Signed)
Subjective:    I'm seeing this patient as a consultation for:  Howard Pouch, DO  CC: left knee pain  HPI: Meghan Hayes, a 27yo female with pmh significant for posterior tibialis tendinitis, is presenting in clinic today with cc of left knee pain.  Pt reports that  Yesterday, 13 May,  while she was standing she felt a painful pop when she went to bend her knee.  Pt reports that she was unable to bare weight on the leg immediately after and that the knee pain  radiated to her hip.  Pt reports that she was able to bare weight hours after the initial injury, but reports that activity will worsen the pain. Pt reports that she has attempted to ice, rest, and elevate the injured knee to help alleviate the pain.  Paradoxically the pt reports that ice increases her knee soreness.     I reviewed the past medical history, family history, social history, surgical history, and allergies today and no changes were needed.  Please see the problem list section below in epic for further details.  Past Medical History: Past Medical History:  Diagnosis Date  . Allergy    seasonal, nuts, wheat, corn, soy  . Gallbladder polyp 2011   6 mm has had repeat imaging and stable.   Marland Kitchen GERD (gastroesophageal reflux disease)   . Hashimoto's disease 2014  . Ileocecal valve syndrome 2017   followed by dr. Havery Moros  . Migraines   . Thyroid nodule 2014   last [place] 2016: 1.2cm thyroid nodule vs PTH adenoma.    Past Surgical History: Past Surgical History:  Procedure Laterality Date  . TONSILLECTOMY  2008   Social History: Social History   Socioeconomic History  . Marital status: Single    Spouse name: Not on file  . Number of children: 0  . Years of education: BA  . Highest education level: Not on file  Occupational History  . Not on file  Social Needs  . Financial resource strain: Not on file  . Food insecurity:    Worry: Not on file    Inability: Not on file  . Transportation needs:    Medical: Not on  file    Non-medical: Not on file  Tobacco Use  . Smoking status: Never Smoker  . Smokeless tobacco: Never Used  Substance and Sexual Activity  . Alcohol use: No  . Drug use: No  . Sexual activity: Never  Lifestyle  . Physical activity:    Days per week: Not on file    Minutes per session: Not on file  . Stress: Not on file  Relationships  . Social connections:    Talks on phone: Not on file    Gets together: Not on file    Attends religious service: Not on file    Active member of club or organization: Not on file    Attends meetings of clubs or organizations: Not on file    Relationship status: Not on file  Other Topics Concern  . Not on file  Social History Narrative   Pt is single. Just finished her BA degree ans searching for employment.    Takes a daily vitamin.    Wears her seatbelt and bicycle helmet.    Smoke detector in the home.    Feels safe in her relationships.    Family History: Family History  Problem Relation Age of Onset  . Hypertension Father   . Allergies Father   . Hyperlipidemia Father   .  Hypertension Maternal Grandmother   . Colon cancer Maternal Grandmother        colon  . Hypertension Paternal Uncle    Allergies: Allergies  Allergen Reactions  . Corn Oil Anaphylaxis  . Dairy Aid  [Lactase] Swelling    All dairy products make intestines swell  . Other Anaphylaxis  . Wheat Bran Anaphylaxis  . Soy Allergy    Medications: See med rec.  Review of Systems: No headache, visual changes, nausea, vomiting, diarrhea, constipation, dizziness, abdominal pain, skin rash, fevers, chills, night sweats, weight loss, swollen lymph nodes, body aches, joint swelling, muscle aches, chest pain, shortness of breath, mood changes, visual or auditory hallucinations.   Objective:   General: Well Developed, well nourished, and in no acute distress.  Neuro:  Extra-ocular muscles intact, able to move all 4 extremities, sensation grossly intact.  Deep tendon  reflexes tested were normal. Psych: Alert and oriented, mood congruent with affect. ENT:  Ears and nose appear unremarkable.  Hearing grossly normal. Neck: Unremarkable overall appearance, trachea midline.  No visible thyroid enlargement. Eyes: Conjunctivae and lids appear unremarkable.  Pupils equal and round. Skin: Warm and dry, no rashes noted.  Cardiovascular: Pulses palpable, no extremity edema.  Left Knee: Normal to inspection with no erythema or obvious bony abnormalities. Subtle swelling visualized over left knee.  Tender to Palpation over patella with no warmth, or joint line tenderness, or condyle tenderness. ROM full in flexion and extension and lower leg rotation; with exquisite pain felt over the anterior knee when leg is fully extended.  Ligaments with solid consistent endpoints including ACL, PCL, LCL, MCL. Painful patellar compression. Patellar glide without crepitus. Patellar and quadriceps tendons unremarkable. Hamstring and quadriceps strength is normal.    Impression and Recommendations:   This case required medical decision making of moderate complexity.  Assessment-Patellofemoral Syndrome RICE: Pt was instructed to ice the knee regularly, elevate, and was given a knee brace to provide compressive and stabilizing support. Pt reports existing symptoms of gastritis so an appropriate antiinflammatory was prescribed (Celebrix) for pain relief. Pt was also told to complete imaging today (Xray) so as to visualize a baseline.  Pt was advised to follow up in 2 weeks to check on resolution of symptoms.   ___________________________________________ Gwen Her. Dianah Field, M.D., ABFM., CAQSM. Primary Care and [place] Instructor of [place] of [facility]

## 2017-07-06 NOTE — Telephone Encounter (Signed)
Approvedtoday  MOQHUT:[id number];WSFKCL:[id number];Review Type:Prior Auth;Coverage Start Date:06/06/2017;Coverage End Date:07/06/2018; Pharmacy notified.

## 2017-07-15 DIAGNOSIS — G4700 Insomnia, unspecified: Secondary | ICD-10-CM | POA: Diagnosis not present

## 2017-07-15 DIAGNOSIS — R110 Nausea: Secondary | ICD-10-CM | POA: Diagnosis not present

## 2017-07-15 DIAGNOSIS — R109 Unspecified abdominal pain: Secondary | ICD-10-CM | POA: Diagnosis not present

## 2017-07-20 ENCOUNTER — Ambulatory Visit (INDEPENDENT_AMBULATORY_CARE_PROVIDER_SITE_OTHER): Payer: BLUE CROSS/BLUE SHIELD

## 2017-07-20 ENCOUNTER — Ambulatory Visit (INDEPENDENT_AMBULATORY_CARE_PROVIDER_SITE_OTHER): Payer: BLUE CROSS/BLUE SHIELD | Admitting: Sports Medicine

## 2017-07-20 ENCOUNTER — Encounter: Payer: Self-pay | Admitting: Sports Medicine

## 2017-07-20 DIAGNOSIS — M545 Low back pain: Secondary | ICD-10-CM | POA: Diagnosis not present

## 2017-07-20 DIAGNOSIS — M5416 Radiculopathy, lumbar region: Secondary | ICD-10-CM | POA: Diagnosis not present

## 2017-07-20 DIAGNOSIS — M222X2 Patellofemoral disorders, left knee: Secondary | ICD-10-CM

## 2017-07-20 MED ORDER — PREDNISONE 50 MG PO TABS: ORAL_TABLET | ORAL | 0 refills | Status: DC

## 2017-07-20 NOTE — Assessment & Plan Note (Addendum)
With a positive straight leg raise on the right. Suspect some element of spondylolisthesis, she is a bit young for degenerative disc disease. X-rays, lumbar spine rehab exercises, 5 days of prednisone and then restart Celebrex. Return to see me in 1 month. I do foresee an SNRI in her future.

## 2017-07-20 NOTE — Progress Notes (Signed)
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 Pt reports that in the 3 days following the last visit (14 May) she was able to rest her knee.  However, pt reports that in the days after resting, she went on a trip that was physically taxing and she also had to stand for long periods of time at work.  Pt reports that in order to alleviate the weight she bore on her left leg, she compensated by leaning on her right leg. Pt reports that pain in her left knee is currently 6/10 and she has been taking a few otc ibuprofen daily to manage pain symptoms.  Pt also reports that she has not been engaging in the rehabilitative exercises that were disseminated at the last visit.  Pt reports that since her last visit she has also been experiencing left lateral thigh pain that radiates down her leg into her ankle/heel but not into her feet.  Pt reports that she has also been dealing with chronic axial back pain but did not feel that was related to her current lateral thigh pain.  Pt reports that the pain in her lateral thigh is cramping, aching, and sore in quality and and endorses a 8/10 on the pain scale. Pt reports that icing the lateral thigh helps to diminish the pain, and that pain is worsened with activity. Pt reports that ibuprofen has not helped to alleviate the pain in right lateral thigh.  Pt denies exacerbation of pain with vagal maneuvers (laughing, coughing). Pt endorses tingling along the same distribution.  Pt denies saddle anesthesia.  Pt reports a single episode of incontinence  Occurring  In the last two weeks (since being diagnosed with PTF) while leaning forward.   I reviewed the past medical history, family history, social history, surgical history, and allergies today and no changes were needed.  Please see the problem list section below in epic for  further details.  Past Medical History: Past Medical History:  Diagnosis Date  . Allergy    seasonal, nuts, wheat, corn, soy  . Gallbladder polyp 2011   6 mm has had repeat imaging and stable.   Marland Kitchen GERD (gastroesophageal reflux disease)   . Hashimoto's disease 2014  . Ileocecal valve syndrome 2017   followed by dr. Havery Moros  . Migraines   . Thyroid nodule 2014   last [place] 2016: 1.2cm thyroid nodule vs PTH adenoma.    Past Surgical History: Past Surgical History:  Procedure Laterality Date  . TONSILLECTOMY  2008   Social History: Social History   Socioeconomic History  . Marital status: Single    Spouse name: Not on file  . Number of children: 0  . Years of education: BA  . Highest education level: Not on file  Occupational History  . Not on file  Social Needs  . Financial resource strain: Not on file  . Food insecurity:    Worry: Not on file    Inability: Not on file  . Transportation needs:    Medical: Not on file    Non-medical: Not on file  Tobacco Use  . Smoking status: Never Smoker  . Smokeless tobacco: Never Used  Substance and Sexual Activity  . Alcohol use: No  . Drug use: No  . Sexual activity: Never  Lifestyle  . Physical activity:  Days per week: Not on file    Minutes per session: Not on file  . Stress: Not on file  Relationships  . Social connections:    Talks on phone: Not on file    Gets together: Not on file    Attends religious service: Not on file    Active member of club or organization: Not on file    Attends meetings of clubs or organizations: Not on file    Relationship status: Not on file  Other Topics Concern  . Not on file  Social History Narrative   Pt is single. Just finished her BA degree ans searching for employment.    Takes a daily vitamin.    Wears her seatbelt and bicycle helmet.    Smoke detector in the home.    Feels safe in her relationships.    Family History: Family History  Problem Relation Age of Onset    . Hypertension Father   . Allergies Father   . Hyperlipidemia Father   . Hypertension Maternal Grandmother   . Colon cancer Maternal Grandmother        colon  . Hypertension Paternal Uncle    Allergies: Allergies  Allergen Reactions  . Corn Oil Anaphylaxis  . Dairy Aid  [Lactase] Swelling    All dairy products make intestines swell  . Other Anaphylaxis  . Wheat Bran Anaphylaxis  . Soy Allergy    Medications: See med rec.  Review of Systems: No fevers, chills, night sweats, weight loss, chest pain, or shortness of breath.   Objective:    General: Well Developed, well nourished, and in no acute distress.  Neuro: Alert and oriented x3, extra-ocular muscles intact, sensation grossly intact.  HEENT: Normocephalic, atraumatic, pupils equal round reactive to light, neck supple, no masses, no lymphadenopathy, thyroid nonpalpable.  Skin: Warm and dry, no rashes. Cardiac: Regular rate and rhythm, no murmurs rubs or gallops, no lower extremity edema.  Respiratory: Clear to auscultation bilaterally. Not using accessory muscles, speaking in full sentences.  Back Exam:  Inspection: Unremarkable  Motion: Flexion 45 deg, Extension 45 deg, Side Bending to 45 deg bilaterally,  Rotation to 45 deg bilaterally  Right SLR laying: Positive Palpable tenderness: Right Lateral thigh; Right Axial Back. FABER: negative. FADER: negative Reflexes: 2+ at Right patellar tendons,  Strength at foot  Gait unremarkable.    Impression and Recommendations:     Assessment-PTF Syndrome Advised pt to engage in rehabilitative exercises disseminated at last visit; pt is in exquisite pain, so was prescribed short course  Prednisone to diminish pain being felt from exacerbation of inflammatory mediators. Pt was advised to start taking Celebrex after completing 5 day course of Prednisone.    Assessment-Spondylolisthesis (R) It is less likely that pt has degenerative disc disease due to her age, but since  there is pain and tingling following a dermatone (L4-S1), imaging of the lumbar spine (xray) is indicated to more fully assess cc.   Pt given educational handout with rehabilitative exercises included.  Pt was advised to complete rehabilitative exercises nightly to diminish pain long term and to maintain strength and range of motion.  Short course of Prednisone followed by Celebrex should help to diminish pain  Assessment- Stress  Incontinence  Pt was educated on the pathophysiology of her condition. PT was advised to complete sets of approx 100 pelvic muscle tightening maneuvers a few times a day in order to strengtheen her pelvic floor.  Pt was counseled on next steps which include formal  physical therapy if not able to consistently engage in home rehab exercises for PTF and spondylolisthesis; and MRI should symptoms of PTF and Spondylolisthesis not improve with execution of conservative measures.  Pt was advised to follow up in 1 month.   ___________________________________________ Gwen Her. Dianah Field, M.D., ABFM., CAQSM. Primary Care and [place] Instructor of [place] of [facility] of Medicine

## 2017-07-20 NOTE — Assessment & Plan Note (Signed)
Has not done any of her rehab exercises, she does need to get started on this.

## 2017-07-28 DIAGNOSIS — K5900 Constipation, unspecified: Secondary | ICD-10-CM | POA: Diagnosis not present

## 2017-07-28 DIAGNOSIS — R1031 Right lower quadrant pain: Secondary | ICD-10-CM | POA: Diagnosis not present

## 2017-07-28 DIAGNOSIS — K219 Gastro-esophageal reflux disease without esophagitis: Secondary | ICD-10-CM | POA: Diagnosis not present

## 2017-08-23 ENCOUNTER — Ambulatory Visit: Payer: BLUE CROSS/BLUE SHIELD | Admitting: Sports Medicine

## 2017-09-07 DIAGNOSIS — M9902 Segmental and somatic dysfunction of thoracic region: Secondary | ICD-10-CM | POA: Diagnosis not present

## 2017-09-07 DIAGNOSIS — M546 Pain in thoracic spine: Secondary | ICD-10-CM | POA: Diagnosis not present

## 2017-09-07 DIAGNOSIS — M542 Cervicalgia: Secondary | ICD-10-CM | POA: Diagnosis not present

## 2017-09-07 DIAGNOSIS — M9901 Segmental and somatic dysfunction of cervical region: Secondary | ICD-10-CM | POA: Diagnosis not present

## 2017-09-14 DIAGNOSIS — M9902 Segmental and somatic dysfunction of thoracic region: Secondary | ICD-10-CM | POA: Diagnosis not present

## 2017-09-14 DIAGNOSIS — M546 Pain in thoracic spine: Secondary | ICD-10-CM | POA: Diagnosis not present

## 2017-09-14 DIAGNOSIS — M542 Cervicalgia: Secondary | ICD-10-CM | POA: Diagnosis not present

## 2017-09-14 DIAGNOSIS — M9901 Segmental and somatic dysfunction of cervical region: Secondary | ICD-10-CM | POA: Diagnosis not present

## 2017-10-04 DIAGNOSIS — M9901 Segmental and somatic dysfunction of cervical region: Secondary | ICD-10-CM | POA: Diagnosis not present

## 2017-10-04 DIAGNOSIS — M9902 Segmental and somatic dysfunction of thoracic region: Secondary | ICD-10-CM | POA: Diagnosis not present

## 2017-10-04 DIAGNOSIS — M542 Cervicalgia: Secondary | ICD-10-CM | POA: Diagnosis not present

## 2017-10-04 DIAGNOSIS — M546 Pain in thoracic spine: Secondary | ICD-10-CM | POA: Diagnosis not present

## 2017-10-18 DIAGNOSIS — M542 Cervicalgia: Secondary | ICD-10-CM | POA: Diagnosis not present

## 2017-10-18 DIAGNOSIS — M546 Pain in thoracic spine: Secondary | ICD-10-CM | POA: Diagnosis not present

## 2017-10-18 DIAGNOSIS — M9902 Segmental and somatic dysfunction of thoracic region: Secondary | ICD-10-CM | POA: Diagnosis not present

## 2017-10-18 DIAGNOSIS — M9901 Segmental and somatic dysfunction of cervical region: Secondary | ICD-10-CM | POA: Diagnosis not present

## 2017-10-18 DIAGNOSIS — E063 Autoimmune thyroiditis: Secondary | ICD-10-CM | POA: Diagnosis not present

## 2017-11-08 DIAGNOSIS — M546 Pain in thoracic spine: Secondary | ICD-10-CM | POA: Diagnosis not present

## 2017-11-08 DIAGNOSIS — M9901 Segmental and somatic dysfunction of cervical region: Secondary | ICD-10-CM | POA: Diagnosis not present

## 2017-11-08 DIAGNOSIS — M542 Cervicalgia: Secondary | ICD-10-CM | POA: Diagnosis not present

## 2017-11-08 DIAGNOSIS — M9902 Segmental and somatic dysfunction of thoracic region: Secondary | ICD-10-CM | POA: Diagnosis not present

## 2017-12-06 DIAGNOSIS — M546 Pain in thoracic spine: Secondary | ICD-10-CM | POA: Diagnosis not present

## 2017-12-06 DIAGNOSIS — M9902 Segmental and somatic dysfunction of thoracic region: Secondary | ICD-10-CM | POA: Diagnosis not present

## 2017-12-06 DIAGNOSIS — M542 Cervicalgia: Secondary | ICD-10-CM | POA: Diagnosis not present

## 2017-12-06 DIAGNOSIS — M9901 Segmental and somatic dysfunction of cervical region: Secondary | ICD-10-CM | POA: Diagnosis not present

## 2017-12-22 DIAGNOSIS — H9203 Otalgia, bilateral: Secondary | ICD-10-CM | POA: Diagnosis not present

## 2017-12-29 DIAGNOSIS — H6501 Acute serous otitis media, right ear: Secondary | ICD-10-CM | POA: Diagnosis not present

## 2018-01-18 ENCOUNTER — Ambulatory Visit: Payer: BLUE CROSS/BLUE SHIELD | Admitting: Family Medicine

## 2018-01-18 ENCOUNTER — Encounter: Payer: Self-pay | Admitting: Family Medicine

## 2018-01-18 VITALS — BP 104/72 | HR 129 | Temp 98.7°F | Resp 16 | Ht 68.0 in | Wt 119.1 lb

## 2018-01-18 DIAGNOSIS — J0100 Acute maxillary sinusitis, unspecified: Secondary | ICD-10-CM | POA: Diagnosis not present

## 2018-01-18 DIAGNOSIS — E063 Autoimmune thyroiditis: Secondary | ICD-10-CM

## 2018-01-18 DIAGNOSIS — R590 Localized enlarged lymph nodes: Secondary | ICD-10-CM

## 2018-01-18 DIAGNOSIS — H9201 Otalgia, right ear: Secondary | ICD-10-CM

## 2018-01-18 MED ORDER — CEFDINIR 300 MG PO CAPS: 300.0000 mg | ORAL_CAPSULE | Freq: Two times a day (BID) | ORAL | 0 refills | Status: DC

## 2018-01-18 MED ORDER — LEVOCETIRIZINE DIHYDROCHLORIDE 5 MG PO TABS: 5.0000 mg | ORAL_TABLET | Freq: Every evening | ORAL | 3 refills | Status: DC

## 2018-01-18 MED ORDER — EPINEPHRINE 0.3 MG/0.3ML IJ SOAJ: INTRAMUSCULAR | 1 refills | Status: DC

## 2018-01-18 MED ORDER — FLUTICASONE PROPIONATE 50 MCG/ACT NA SUSP: 2.0000 | Freq: Every day | NASAL | 6 refills | Status: DC

## 2018-01-18 NOTE — Patient Instructions (Signed)
Rest, hydrate.  + flonase, mucinex (DM if cough), nettie pot or nasal saline.  Start xyzal nightly- antihistamine omnicef every 12 hours for 10 days prescribed, take until completed.  If cough present it can last up to 6-8 weeks.  F/U 2 weeks of not improved.     Sinusitis, Adult Sinusitis is soreness and inflammation of your sinuses. Sinuses are hollow spaces in the bones around your face. They are located:  Around your eyes.  In the middle of your forehead.  Behind your nose.  In your cheekbones.  Your sinuses and nasal passages are lined with a stringy fluid (mucus). Mucus normally drains out of your sinuses. When your nasal tissues get inflamed or swollen, the mucus can get trapped or blocked so air cannot flow through your sinuses. This lets bacteria, viruses, and funguses grow, and that leads to infection. Follow these instructions at home: Medicines  Take, use, or apply over-the-counter and prescription medicines only as told by your doctor. These may include nasal sprays.  If you were prescribed an antibiotic medicine, take it as told by your doctor. Do not stop taking the antibiotic even if you start to feel better. Hydrate and Humidify  Drink enough water to keep your pee (urine) clear or pale yellow.  Use a cool mist humidifier to keep the humidity level in your home above 50%.  Breathe in steam for 10-15 minutes, 3-4 times a day or as told by your doctor. You can do this in the bathroom while a hot shower is running.  Try not to spend time in cool or dry air. Rest  Rest as much as possible.  Sleep with your head raised (elevated).  Make sure to get enough sleep each night. General instructions  Put a warm, moist washcloth on your face 3-4 times a day or as told by your doctor. This will help with discomfort.  Wash your hands often with soap and water. If there is no soap and water, use hand sanitizer.  Do not smoke. Avoid being around people who are  smoking (secondhand smoke).  Keep all follow-up visits as told by your doctor. This is important. Contact a doctor if:  You have a fever.  Your symptoms get worse.  Your symptoms do not get better within 10 days. Get help right away if:  You have a very bad headache.  You cannot stop throwing up (vomiting).  You have pain or swelling around your face or eyes.  You have trouble seeing.  You feel confused.  Your neck is stiff.  You have trouble breathing. This information is not intended to replace advice given to you by your health care provider. Make sure you discuss any questions you have with your health care provider. Document Released: 07/29/2007 Document Revised: 10/06/2015 Document Reviewed: 12/05/2014 Elsevier Interactive Patient Education  [employer].

## 2018-01-18 NOTE — Progress Notes (Signed)
Meghan Hayes , 11-Dec-1990, 27 y.o., female MRN: [id number] Patient Care Team    Relationship Specialty Notifications Start End  Meghan Hillock, DO PCP - General Family Medicine  04/14/16   Meghan Kingdom, MD Consulting Physician Internal Medicine  04/14/16    Comment: endocrine  Meghan Hayes, Meghan Raspberry, MD Consulting Physician Gastroenterology  04/14/16   Aloha Gell, MD Consulting Physician Obstetrics and Gynecology  04/14/16     Chief Complaint  Patient presents with  . Swollen Glands    Allergy or Viral?     Subjective: Pt presents for an OV with multiple complaints.  She reports she is tired all the time. She had nausea/vomit as reason for appt, but reports that is just from her nerves and not her issue today. Today she wants to discuss her throat pain. She states this is a different pain then she has been seen prior for throat pain. She has swollen lymph nodes behind her right ear and right side of neck. She feels exhausted. She has had hot sweats and chills.  Symptoms started 4-5 days ago. However she was treated w/ steroids per her report a few weeks ago for similar complaints with her ear.  She also mentions leg weakness and arm weakness. 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 She is taking benadryl- frequently. She is not on a daily medicine. Her Hayes is being managed by an integrative med doc near her college- she just moved back to the area.   No flowsheet data found.  Allergies  Allergen Reactions  . Corn Oil Anaphylaxis  . Dairy Aid  [Lactase] Swelling    All dairy products make intestines swell  . Other Anaphylaxis  . Wheat Bran Anaphylaxis  . Eggs Or Egg-Derived Products     abd swelling  . Soy Allergy    Social History   Tobacco Use  . Smoking status: Never Smoker  . Smokeless tobacco: Never Used  Substance Use Topics  . Alcohol use: No   Past Medical History:  Diagnosis Date  . Allergy    seasonal, nuts, wheat, corn,  soy  . Gallbladder polyp 2011   6 mm has had repeat imaging and stable.   Marland Kitchen GERD (gastroesophageal reflux disease)   . Hashimoto's disease 2014  . Ileocecal valve syndrome 2017   followed by dr. Havery Hayes  . Migraines   . Hayes nodule 2014   last [place] 2016: 1.2cm Hayes nodule vs PTH adenoma.    Past Surgical History:  Procedure Laterality Date  . TONSILLECTOMY  2008   Family History  Problem Relation Age of Onset  . Hypertension Father   . Allergies Father   . Hyperlipidemia Father   . Hypertension Maternal Grandmother   . Colon cancer Maternal Grandmother        colon  . Hypertension Paternal Uncle    Allergies as of 01/18/2018      Reactions   Corn Oil Anaphylaxis   Dairy Aid  [lactase] Swelling   All dairy products make intestines swell   Other Anaphylaxis   Wheat Bran Anaphylaxis   Eggs Or Egg-derived Products    abd swelling   Soy Allergy       Medication List        Accurate as of 01/18/18  2:16 PM. Always use your most recent med list.          BENADRYL ALLERGY PO Take by mouth as needed.   EPINEPHrine 0.3 mg/0.3  mL Soaj injection Commonly known as:  EPI-PEN INJECT 0.3 MLS (0.3 MG TOTAL) INTO THE MUSCLE ONCE AS NEEDED FOR ANAPHYLAXIS.   Meghan Hayes 30 MG tablet Generic drug:  Hayes Take 30 mg by mouth daily.   pantoprazole 40 MG tablet Commonly known as:  PROTONIX TAKE 1 TABLET BY MOUTH EVERY DAY       All past medical history, surgical history, allergies, family history, immunizations andmedications were updated in the EMR today and reviewed under the history and medication portions of their EMR.     ROS: Negative, with the exception of above mentioned in HPI   Objective:  BP 104/72 (BP Location: Left Arm, Patient Position: Sitting, Cuff Size: Small)   Pulse (!) 129   Temp 98.7 F (37.1 C) (Oral)   Resp 16   Ht 5\' 8"  (1.727 m)   Wt 119 lb 2 oz (54 kg)   LMP 01/07/2018   SpO2 96%   BMI 18.11 kg/m  Body mass index is 18.11  kg/m. Gen: Afebrile. No acute distress. Nontoxic in appearance, well developed, well nourished. Anxious appearing female.  HENT: AT. [place]. Bilateral TM visualized w/ no erythema- bilateral fullness present. MMM, no oral lesions. Bilateral nares with erythema, mild drainage. Throat without erythema or exudates. No cough or sinus pressure. TTP right mastoid.  Eyes:Pupils Equal Round Reactive to light, Extraocular movements intact,  Conjunctiva without redness, discharge or icterus. Neck/lymp/endocrine: Supple, moderate right anterior cervical  lymphadenopathy CV: tachycardic during check in- not tachycardic during exam.  Chest: CTAB, no wheeze or crackles. Good air movement, normal resp effort.  Skin: no rashes, purpura or petechiae.  Neuro:  Normal gait. PERLA. EOMi. Alert. Oriented x3  No exam data present No results found. No results found for this or any previous visit (from the past 24 hour(s)).  Assessment/Plan: Meghan Hayes is a 27 y.o. female present for OV for  Hashimoto's thyroiditis Uncertain if her vague leg weakness and unable to move them is attributable to her Hayes function- sounds anxiety related. Obtain TSH today and monitor closely. Pt reports compliance with Meghan Hayes 30 mg QD, she has been stable on dose for 2 years. Usually managed by Int.med near her college.  - TSH - uncertain if she had the repeat US recommended for nodule due 04/2017- since she was being managed by another provider. If she has not will order on cal back with her labs.   Acute non-recurrent maxillary sinusitis/Pain of right mastoid/ LAD (lymphadenopathy) of right cervical region VSS, no tachycardic on actual exam. She seems rather anxious today. Advised her to start a daily allergy med- called in xyzal. If she can get another job, away from  Things she is allergic, would recommend. She does have tenderness right mastoid- no redness. Tenderness right cervical chain lymph nodes.  Rest, hydrate.  +  flonase, mucinex (DM if cough), nettie pot or nasal saline.  Start xyzal nightly- antihistamine omnicef every 12 hours for 10 days prescribed, take until completed.  If cough present it can last up to 6-8 weeks.  F/U 2 weeks of not improved.    Reviewed expectations re: course of current medical issues.  Discussed self-management of symptoms.  Outlined signs and symptoms indicating need for more acute intervention.  Patient verbalized understanding and all questions were answered.  Patient received an After-Visit Summary.    No orders of the defined types were placed in this encounter.     Note is dictated utilizing voice recognition software. Although note  has been proof read prior to signing, occasional typographical errors still can be missed. If any questions arise, please do not hesitate to call for verification.   electronically signed by:  Howard Pouch, DO  [place]

## 2018-01-19 ENCOUNTER — Telehealth: Payer: Self-pay | Admitting: Family Medicine

## 2018-01-19 LAB — TSH: TSH: 1.11 u[IU]/mL (ref 0.35–4.50)

## 2018-01-19 NOTE — Telephone Encounter (Signed)
Copied from [place] [id number]. Topic: General - Other >> Jan 19, 2018  1:16 PM Bea Graff, NT wrote: Reason for CRM: Pts mom calling and states her daughter is still feeling bad and mom is wanting to see if a note for her to be out of work Friday, Saturday and Sunday can be wrote before the office closes today.   Okay to excuse patient from work per Dr Anitra Lauth.  Letter printed.

## 2018-01-23 DIAGNOSIS — Z79899 Other long term (current) drug therapy: Secondary | ICD-10-CM | POA: Diagnosis not present

## 2018-01-23 DIAGNOSIS — K219 Gastro-esophageal reflux disease without esophagitis: Secondary | ICD-10-CM | POA: Diagnosis not present

## 2018-01-23 DIAGNOSIS — Z91018 Allergy to other foods: Secondary | ICD-10-CM | POA: Diagnosis not present

## 2018-01-23 DIAGNOSIS — Z881 Allergy status to other antibiotic agents status: Secondary | ICD-10-CM | POA: Diagnosis not present

## 2018-01-23 DIAGNOSIS — E079 Disorder of thyroid, unspecified: Secondary | ICD-10-CM | POA: Diagnosis not present

## 2018-01-23 DIAGNOSIS — R0602 Shortness of breath: Secondary | ICD-10-CM | POA: Diagnosis not present

## 2018-01-23 DIAGNOSIS — X58XXXA Exposure to other specified factors, initial encounter: Secondary | ICD-10-CM | POA: Diagnosis not present

## 2018-01-23 DIAGNOSIS — T7840XA Allergy, unspecified, initial encounter: Secondary | ICD-10-CM | POA: Diagnosis not present

## 2018-01-23 DIAGNOSIS — R21 Rash and other nonspecific skin eruption: Secondary | ICD-10-CM | POA: Diagnosis not present

## 2018-01-24 ENCOUNTER — Telehealth: Payer: Self-pay | Admitting: Family Medicine

## 2018-01-24 ENCOUNTER — Telehealth: Payer: Self-pay | Admitting: *Deleted

## 2018-01-24 ENCOUNTER — Other Ambulatory Visit: Payer: Self-pay | Admitting: Family Medicine

## 2018-01-24 DIAGNOSIS — E063 Autoimmune thyroiditis: Secondary | ICD-10-CM

## 2018-01-24 DIAGNOSIS — E041 Nontoxic single thyroid nodule: Secondary | ICD-10-CM

## 2018-01-24 MED ORDER — AMOXICILLIN 500 MG PO CAPS: 500.0000 mg | ORAL_CAPSULE | Freq: Three times a day (TID) | ORAL | 0 refills | Status: DC

## 2018-01-24 MED ORDER — NP THYROID 30 MG PO TABS: 30.0000 mg | ORAL_TABLET | Freq: Every day | ORAL | 3 refills | Status: DC

## 2018-01-24 NOTE — Telephone Encounter (Signed)
Advised patient of prescription change and ultrasound order. Patient states she would like to keep appt for 01/25/18 for now, will call back to cancel.

## 2018-01-24 NOTE — Telephone Encounter (Addendum)
Please inform patient the following information: Her thyroid level is normal. I have refilled her med and sent to [facility].  Please ask her if she had the repeat US of her thyroid that was due 04/2017 completed at her integrative med doc that managed her thyroid condition over the last year?   - If not we will order- she was due for a rpt in 1 year from when completed 04/2016 to check on the nodules. Please advise.   - F/U 2-4 weeks if still having symptoms and desires further evaluation Otherwise yearly CPE follow up

## 2018-01-24 NOTE — Telephone Encounter (Signed)
See previous note

## 2018-01-24 NOTE — Telephone Encounter (Signed)
Copied from [name] [id number]. Topic: Appointment Scheduling - Prior Auth Required for Appointment >> Jan 24, 2018  8:28 AM Pilar Grammes F wrote: No appointment has been scheduled. Patient is requesting a Meghan Hayes in [place] ER f/u visit for an allergic reaction to an antibiotic she was given by Dr. Raoul Pitch last week. She is still sick and would like a new antibiotic.  So she needs to see Dr. Raoul Pitch, but there are no openings today with Dr. Raoul Pitch.  Her mom Altha Harm called in for her because the patient is sick.  Per scheduling protocol, this appointment requires a prior authorization prior to scheduling.  Route to department's PEC pool.

## 2018-01-24 NOTE — Telephone Encounter (Signed)
Please call pt: - I have called amox TID. It is literally on eof the only abx besides the omnicef that does not contain corn byproduct.  - I will order the [place] of thyroid and she is to follow up 2-4 weeks if not improved. She can cancel the appt tomorrow as long as she is doing ok from the reaction standpoint.

## 2018-01-24 NOTE — Telephone Encounter (Signed)
Patient advised of Thyroid lab results. Patient states she did not have a repeat thyroid ultrasound after 1 year and would like to proceed with repeating.   Please see CRM note below. Spoke with patient, she reports continued "lump in my throat" and difficulty swallowing. Patient currently not taking antibiotic d/t reaction to [facility] and antibiotic received at [facility] ER (Clindamycin) contains corn which she is allergic to. Pt requesting new antibiotic, does have appt with PCP 01/25/18. Advised to go back to ER if breathing is affected, voiced understanding.   Please advise.    Copied from [place] [id number]. Topic: Appointment Scheduling - Prior Auth Required for Appointment >> Jan 24, 2018  8:28 AM Pilar Grammes F wrote: No appointment has been scheduled. Patient is requesting a [facility] in [place] ER f/u visit for an allergic reaction to an antibiotic she was given by Dr. Raoul Pitch last week. She is still sick and would like a new antibiotic.  So she needs to see Dr. Raoul Pitch, but there are no openings today with Dr. Raoul Pitch.  Her mom Altha Harm called in for her because the patient is sick.  Per scheduling protocol, this appointment requires a prior authorization prior to scheduling.

## 2018-01-25 ENCOUNTER — Encounter

## 2018-01-25 ENCOUNTER — Ambulatory Visit: Payer: BLUE CROSS/BLUE SHIELD | Admitting: Family Medicine

## 2018-01-25 ENCOUNTER — Other Ambulatory Visit: Payer: BLUE CROSS/BLUE SHIELD

## 2018-01-25 ENCOUNTER — Ambulatory Visit
Admission: RE | Admit: 2018-01-25 | Discharge: 2018-01-25 | Disposition: A | Discharge status: Home / Self Care | Payer: BLUE CROSS/BLUE SHIELD | Source: Ambulatory Visit | Attending: Family Medicine | Admitting: Family Medicine

## 2018-01-25 ENCOUNTER — Encounter: Payer: Self-pay | Admitting: Family Medicine

## 2018-01-25 VITALS — BP 108/75 | HR 100 | Temp 98.1°F | Resp 16 | Ht 68.0 in | Wt 120.0 lb

## 2018-01-25 DIAGNOSIS — R070 Pain in throat: Secondary | ICD-10-CM

## 2018-01-25 DIAGNOSIS — E063 Autoimmune thyroiditis: Secondary | ICD-10-CM

## 2018-01-25 DIAGNOSIS — E041 Nontoxic single thyroid nodule: Secondary | ICD-10-CM

## 2018-01-25 DIAGNOSIS — R1310 Dysphagia, unspecified: Secondary | ICD-10-CM

## 2018-01-25 DIAGNOSIS — R110 Nausea: Secondary | ICD-10-CM

## 2018-01-25 DIAGNOSIS — Z889 Allergy status to unspecified drugs, medicaments and biological substances status: Secondary | ICD-10-CM

## 2018-01-25 DIAGNOSIS — R1319 Other dysphagia: Secondary | ICD-10-CM

## 2018-01-25 DIAGNOSIS — H9201 Otalgia, right ear: Secondary | ICD-10-CM | POA: Diagnosis not present

## 2018-01-25 DIAGNOSIS — J019 Acute sinusitis, unspecified: Secondary | ICD-10-CM

## 2018-01-25 NOTE — Progress Notes (Signed)
Meghan Hayes , Aug 19, 1990, 27 y.o., female MRN: [id number] Patient Care Team    Relationship Specialty Notifications Start End  Ma Hillock, DO PCP - General Family Medicine  04/14/16   Philemon Kingdom, MD Consulting Physician Internal Medicine  04/14/16    Comment: endocrine  Armbruster, Carlota Raspberry, MD Consulting Physician Gastroenterology  04/14/16   Aloha Gell, MD Consulting Physician Obstetrics and Gynecology  04/14/16     Chief Complaint  Patient presents with  . discuss medication     Subjective:  Meghan Hayes is a 27 y.o. present to day with her father to discuss her symptoms, [place] report and reaction to [facility].  Briefly she was seen 11/26 for throat pain. She had signs of sinus infection with right lymphadenitis. She was prescribed omnicef- which she broke out in a rash and felt her throat symptoms worsened to the point she could not swallow. She was seen in the ED, provided with prednisone and another abx. She reports she did not start the 2nd abx prescribed by ED because it contained corn and she is allergic. Amox was called in for her after her ED visit by this provider. She has not started the amox, Flonase or xyzal prescribed. 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 Feeling fatigued. In addition she does have a known enlarged thyroid and nodules. She reports feeling a great deal of pressure on her throat and still feeling like she had difficulty swallowing. She is very worried her throat is compressed. Her TSH was normal. She is complaint with her thyroid replacement. Repeat US results from earlier today were reviewed with her. US IMPRESSION: 1. No interval change in the tie rads 4 nodule versus pseudo nodule arising posteriorly within the left mid gland. This nodule meets criteria for continued annual ultrasound surveillance until 5 year stability has been confirmed. The  nodule has been stable since 06/27/2013. 2. Diffusely heterogeneous background thyroid parenchyma.   Prior note: She reports she is tired all the time. She had nausea/vomit as reason for appt, but reports that is just from her nerves and not her issue today. Today she wants to discuss her throat pain. She states this is a different pain then she has been seen prior for throat pain. She has swollen lymph nodes behind her right ear and right side of neck. She feels exhausted. She has had hot sweats and chills.  Symptoms started 4-5 days ago. However she was treated w/ steroids per her report a few weeks ago for similar complaints with her ear.  She also mentions leg weakness and arm weakness. 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 She is taking benadryl- frequently. She is not on a daily medicine. Her thyroid is being managed by an integrative med doc near her college- she just moved back to the area.   No flowsheet data found.  Allergies  Allergen Reactions  . Corn Oil Anaphylaxis  . Dairy Aid  [Lactase] Swelling    All dairy products make intestines swell  . Other Anaphylaxis  . Wheat Bran Anaphylaxis  . Omnicef [Cefdinir] Rash  . Eggs Or Egg-Derived Products     abd swelling  . Soy Allergy    Social History   Tobacco Use  . Smoking status: Never Smoker  . Smokeless tobacco: Never Used  Substance Use Topics  . Alcohol use: No  Past Medical History:  Diagnosis Date  . Allergy    seasonal, nuts, wheat, corn, soy  . Gallbladder polyp 2011   6 mm has had repeat imaging and stable.   Marland Kitchen GERD (gastroesophageal reflux disease)   . Hashimoto's disease 2014  . Ileocecal valve syndrome 2017   followed by dr. Havery Moros  . Migraines   . Thyroid nodule 2014   last [place] 2016: 1.2cm thyroid nodule vs PTH adenoma.    Past Surgical History:  Procedure Laterality Date  . TONSILLECTOMY  2008   Family History  Problem Relation Age of Onset  . Hypertension  Father   . Allergies Father   . Hyperlipidemia Father   . Hypertension Maternal Grandmother   . Colon cancer Maternal Grandmother        colon  . Hypertension Paternal Uncle    Allergies as of 01/25/2018      Reactions   Corn Oil Anaphylaxis   Dairy Aid  [lactase] Swelling   All dairy products make intestines swell   Other Anaphylaxis   Wheat Bran Anaphylaxis   Omnicef [cefdinir] Rash   Eggs Or Egg-derived Products    abd swelling   Soy Allergy       Medication List        Accurate as of 01/25/18  3:41 PM. Always use your most recent med list.          amoxicillin 500 MG capsule Commonly known as:  AMOXIL Take 1 capsule (500 mg total) by mouth 3 (three) times daily.   EPINEPHrine 0.3 mg/0.3 mL Soaj injection Commonly known as:  EPI-PEN INJECT 0.3 MLS (0.3 MG TOTAL) INTO THE MUSCLE ONCE AS NEEDED FOR ANAPHYLAXIS.   fluticasone 50 MCG/ACT nasal spray Commonly known as:  FLONASE Place 2 sprays into both nostrils daily.   levocetirizine 5 MG tablet Commonly known as:  XYZAL Take 1 tablet (5 mg total) by mouth every evening.   NP THYROID 30 MG tablet Generic drug:  thyroid Take 1 tablet (30 mg total) by mouth daily.   pantoprazole 40 MG tablet Commonly known as:  PROTONIX TAKE 1 TABLET BY MOUTH EVERY DAY       All past medical history, surgical history, allergies, family history, immunizations andmedications were updated in the EMR today and reviewed under the history and medication portions of their EMR.     ROS: Negative, with the exception of above mentioned in HPI   Objective:  BP 108/75 (BP Location: Right Arm, Patient Position: Sitting, Cuff Size: Normal)   Pulse 100   Temp 98.1 F (36.7 C) (Oral)   Resp 16   Ht 5\' 8"  (1.727 m)   Wt 120 lb (54.4 kg)   LMP 01/07/2018   SpO2 98%   BMI 18.25 kg/m  Body mass index is 18.25 kg/m. Gen: Afebrile. No acute distress. Nontoxic in appearance, well developed, well nourished. Anxious appearing female.    HENT: AT. [place]. Bilateral TM visualized w/ no erythema- bilateral fullness present. MMM, no oral lesions. Bilateral nares with erythema, mild drainage. Throat without erythema or exudates. No cough or sinus pressure. TTP right mastoid.  Eyes:Pupils Equal Round Reactive to light, Extraocular movements intact,  Conjunctiva without redness, discharge or icterus. Neck/lymp/endocrine: Supple, moderate right anterior cervical  lymphadenopathy CV: tachycardic during check in- not tachycardic during exam.  Chest: CTAB, no wheeze or crackles. Good air movement, normal resp effort.  Skin: no rashes, purpura or petechiae.  Neuro:  Normal gait. PERLA. EOMi. Alert. Oriented x3  No exam data present US Thyroid  Result Date: 01/25/2018 CLINICAL DATA:  Prior ultrasound follow-up.  Follow-up nodule EXAM: THYROID ULTRASOUND TECHNIQUE: Ultrasound examination of the thyroid gland and adjacent soft tissues was performed. COMPARISON:  04/24/2016 FINDINGS: Parenchymal Echotexture: Mildly heterogenous Isthmus: 0.5 cm, previously 0.5 cm Right lobe: 5.1 x 2.0 x 1.5 cm, previously 5.3 x 1.4 x 1.9 cm Left lobe: 4.2 x 1.2 x 1.4 cm, previously 4.1 x 1.4 x 1.7 cm _________________________________________________________ Estimated total number of nodules >/= 1 cm: 1 Number of spongiform nodules >/=  2 cm not described below (TR1): 0 Number of mixed cystic and solid nodules >/= 1.5 cm not described below ([place]): 0 _________________________________________________________ Nodule # 1: Prior biopsy: No Location: Left; Mid Maximum size: 1.1 cm; Other 2 dimensions: 0.7 x 0.5 cm, previously, 1.3 x 0.8 x 0.5 cm Composition: solid/almost completely solid (2) Echogenicity: hypoechoic (2) Shape: not taller-than-wide (0) Margins: smooth (0) Echogenic foci: none (0) ACR TI-RADS total points: 4. ACR TI-RADS risk category:  TR4 (4-6 points). Significant change in size (>/= 20% in two dimensions and minimal increase of 2 mm): No Change in features: No  Change in ACR TI-RADS risk category: No ACR TI-RADS recommendations: *Given size (>/= 1 - 1.4 cm) and appearance, a follow-up ultrasound in 1 year should be considered based on TI-RADS criteria. _________________________________________________________ IMPRESSION: Left nodule 1 is stable and meets criteria for annual follow-up. The above is in keeping with the ACR TI-RADS recommendations - J Am Coll Radiol 2017;14:587-595. Electronically Signed   By: Marybelle Killings M.D.   On: 01/25/2018 09:30   No results found for this or any previous visit (from the past 24 hour(s)).  Assessment/Plan: Meghan Hayes is a 27 y.o. female present for OV for  Hashimoto's thyroiditis - TSH 01/26/2018 WNL Pt reports compliance with NP THYROID 30 mg QD, she has been stable on dose for 2 years.  - Thyroid US with presence of ongoing enlargement and stable nodule for 4 years. - discussed the possibility of thyroid compression symptoms vs globus hystericus vs allergies vs GI issues/dysphagia - She has moved back to the area and will need and endocrinologist again locally. Will refer back to Dr. Letta Median who she saw many years ago. Acute non-recurrent maxillary sinusitis/Pain of right mastoid/ LAD (lymphadenopathy) of right cervical region START flonase, amox and xyzal. Stop routine use of benadryl.  Refer to ENT for f/u on  allergies, poss sinus causes for throat sensation, right ant cervical lymphadenothy and pain.  Dysphagia/throat pain:  - refer to GI for difficulty swallowing.  - barium swallow ordered in the meantime. Patient's last menstrual period was 01/07/2018. Many questions asked by patient today. All answered for her today.    Reviewed expectations re: course of current medical issues.  Discussed self-management of symptoms.  Outlined signs and symptoms indicating need for more acute intervention.  Patient verbalized understanding and all questions were answered.  Patient received an After-Visit  Summary.    No orders of the defined types were placed in this encounter.     Note is dictated utilizing voice recognition software. Although note has been proof read prior to signing, occasional typographical errors still can be missed. If any questions arise, please do not hesitate to call for verification.   electronically signed by:  Howard Pouch, DO  [place]

## 2018-01-25 NOTE — Patient Instructions (Signed)
Start xyzal every night.  Start amoxicillin take until completed.  Try to stop the benadryl   Your thyroid US is about the same- some areas even improved.   I will refer you to : Gastroenterology for evaluation on your throat sensation ENT for ears/sinus and thyroid nodule/enlargement Endocrinologist for your thyroid dysfunction I am ordering a barium swallow study while we get you into the Gastroenterologist.   Dysphagia Dysphagia is trouble swallowing. This condition occurs when solids and liquids stick in a person's throat on the way down to the stomach, or when food takes longer to get to the stomach. You may have problems swallowing food, liquids, or both. You may also have pain while trying to swallow. It may take you more time and effort to swallow something. What are the causes? This condition is caused by:  Problems with the muscles. They may make it difficult for you to move food and liquids through the tube that connects your mouth to your stomach (esophagus). You may have ulcers, scar tissue, or inflammation that blocks the normal passage of food and liquids. Causes of these problems include: ? Acid reflux from your stomach into your esophagus (gastroesophageal reflux). ? Infections. ? Radiation treatment for cancer. ? Medicines taken without enough fluids to wash them down into your stomach.  Nerve problems. These prevent signals from being sent to the muscles of your esophagus to squeeze (contract) and move what you swallow down to your stomach.  Globus pharyngeus. This is a common problem that involves feeling like something is stuck in the throat or a sense of trouble with swallowing even though nothing is wrong with the swallowing passages.  Stroke. This can affect the nerves and make it difficult to swallow.  Certain conditions, such as cerebral palsy or Parkinson disease.  What are the signs or symptoms? Common symptoms of this condition include:  A feeling that  solids or liquids are stuck in your throat on the way down to the stomach.  Food taking too long to get to the stomach.  Other symptoms include:  Food moving back from your stomach to your mouth (regurgitation).  Noises coming from your throat.  Chest discomfort with swallowing.  A feeling of fullness when swallowing.  Drooling, especially when the throat is blocked.  Pain while swallowing.  Heartburn.  Coughing or gagging while trying to swallow.  How is this diagnosed? This condition is diagnosed by:  Barium X-ray. In this test, you swallow a white substance (contrast medium)that sticks to the inside of your esophagus. X-ray images are then taken.  Endoscopy. In this test, a flexible telescope is inserted down your throat to look at your esophagus and your stomach.  CT scans and MRI.  How is this treated? Treatment for dysphagia depends on the cause of the condition:  If the dysphagia is caused by acid reflux or infection, medicines may be used. They may include antibiotics and heartburn medicines.  If the dysphagia is caused by problems with your muscles, swallowing therapy may be used to help you strengthen your swallowing muscles. You may have to do specific exercises to strengthen the muscles or stretch them.  If the dysphagia is caused by a blockage or mass, procedures to remove the blockage may be done. You may need surgery and a feeding tube.  You may need to make diet changes. Ask your health care provider for specific instructions. Follow these instructions at home: Eating and drinking  Try to eat soft food that is  easier to swallow.  Follow any diet changes as told by your health care provider.  Cut your food into small pieces and eat slowly.  Eat and drink only when you are sitting upright.  Do not drink alcohol or caffeine. If you need help quitting, ask your health care provider. General instructions  Check your weight every day to make sure you  are not losing weight.  Take over-the-counter and prescription medicines only as told by your health care provider.  If you were prescribed an antibiotic medicine, take it as told by your health care provider. Do not stop taking the antibiotic even if you start to feel better.  Do not use any products that contain nicotine or tobacco, such as cigarettes and e-cigarettes. If you need help quitting, ask your health care provider.  Keep all follow-up visits as told by your health care provider. This is important. Contact a health care provider if:  You lose weight because you cannot swallow.  You cough when you drink liquids (aspiration).  You cough up partially digested food. Get help right away if:  You cannot swallow your saliva.  You have shortness of breath or a fever, or both.  You have a hoarse voice and also have trouble swallowing. Summary  Dysphagia is trouble swallowing. This condition occurs when solids and liquids stick in a person's throat on the way down to the stomach, or when food takes longer to get to the stomach.  Dysphagia has many possible causes and symptoms.  Treatment for dysphagia depends on the cause of the condition. This information is not intended to replace advice given to you by your health care provider. Make sure you discuss any questions you have with your health care provider. Document Released: 02/07/2000 Document Revised: 01/30/2016 Document Reviewed: 01/30/2016 Elsevier Interactive Patient Education  2017 [employer].

## 2018-01-26 ENCOUNTER — Encounter: Payer: Self-pay | Admitting: Family Medicine

## 2018-01-28 ENCOUNTER — Telehealth: Payer: Self-pay | Admitting: Internal Medicine

## 2018-01-28 NOTE — Telephone Encounter (Signed)
That is fine with me. Thanks for asking.

## 2018-01-31 ENCOUNTER — Other Ambulatory Visit: Payer: BLUE CROSS/BLUE SHIELD

## 2018-02-02 NOTE — Telephone Encounter (Signed)
Sure

## 2018-02-02 NOTE — Telephone Encounter (Signed)
See note below, ok to schedule with Dr. Henrene Pastor.

## 2018-02-02 NOTE — Telephone Encounter (Signed)
Dr. Henrene Pastor please see note below and advise.

## 2018-02-03 ENCOUNTER — Telehealth: Payer: Self-pay | Admitting: Family Medicine

## 2018-02-03 NOTE — Telephone Encounter (Signed)
Patient walked in this morning asking for a doctor's note for her job. Her job is requiring a physicians note showing everything that has happened to her recently. Patient wrote down a summary.  I'm entering this exactly as written on her paper by patient: 1) Everything that happened from 1st visit. 2)Tues Nov 26-1st visit sinus/ear infection that turned in to mastoiditis 3)put on omnicef (10 days) Sunday Dec 1 4) allergic reaction to medication-called doctors office-nurse said take to ER 5)ER diagnosed as allergic reaction to omnicef-treated and released 6)Tuesday Dec 3-went back to Dr. Raoul Pitch put on new antibiotic/steroids and new allergy medication 7)set up appts with ENT and GI doctor 8)ENT Friday appt Need note outlining everything that happened and excusing her from work from Nov 26-Dec 16 Tentative return date 02/07/18

## 2018-02-03 NOTE — Telephone Encounter (Signed)
Called patient to get clarification on why she needed a letter and to inform her that the provider was out of the office with the potential she may be out tomorrow as well. Patient states shes not entirely sure why but her employer contacted her this morning and stated they wanted a letter by 5 PM today with a detailed reason for her absence. Patient apologized several times stating she knew that this was a burden. I informed Meghan Hayes that I didnt think I could get her a detailed letter by 5 PM today.  Are you OK with [place] providing her with a basic work note from November 26 to December 16? I did not see in her office visit note from December 3rd any reason why she would continue to miss work after that appointment.

## 2018-02-04 DIAGNOSIS — H9203 Otalgia, bilateral: Secondary | ICD-10-CM | POA: Diagnosis not present

## 2018-02-04 DIAGNOSIS — E063 Autoimmune thyroiditis: Secondary | ICD-10-CM | POA: Diagnosis not present

## 2018-02-04 DIAGNOSIS — M26623 Arthralgia of bilateral temporomandibular joint: Secondary | ICD-10-CM | POA: Diagnosis not present

## 2018-02-04 DIAGNOSIS — K219 Gastro-esophageal reflux disease without esophagitis: Secondary | ICD-10-CM | POA: Diagnosis not present

## 2018-02-04 NOTE — Telephone Encounter (Signed)
I can provide a note for her 01/18/2018 until 01/26/2018. There was no conversation of restriction to return to work after her 12/3 appt by this provider. If she had the ENT or GI appts after that time, she will need to also get notes from them. I can only attest to the time we treated her.

## 2018-02-04 NOTE — Telephone Encounter (Signed)
Patient notified and verbalized understanding and stated that she would figure it out.

## 2018-02-07 ENCOUNTER — Ambulatory Visit: Payer: BLUE CROSS/BLUE SHIELD | Admitting: Family Medicine

## 2018-02-11 ENCOUNTER — Ambulatory Visit: Payer: BLUE CROSS/BLUE SHIELD | Admitting: Internal Medicine

## 2018-03-08 ENCOUNTER — Encounter: Payer: Self-pay | Admitting: Internal Medicine

## 2018-03-08 ENCOUNTER — Ambulatory Visit: Payer: BLUE CROSS/BLUE SHIELD | Admitting: Internal Medicine

## 2018-03-08 ENCOUNTER — Other Ambulatory Visit (INDEPENDENT_AMBULATORY_CARE_PROVIDER_SITE_OTHER): Payer: BLUE CROSS/BLUE SHIELD

## 2018-03-08 VITALS — BP 100/60 | HR 80 | Ht 68.0 in | Wt 124.4 lb

## 2018-03-08 DIAGNOSIS — K219 Gastro-esophageal reflux disease without esophagitis: Secondary | ICD-10-CM | POA: Diagnosis not present

## 2018-03-08 DIAGNOSIS — R109 Unspecified abdominal pain: Secondary | ICD-10-CM

## 2018-03-08 DIAGNOSIS — K5900 Constipation, unspecified: Secondary | ICD-10-CM | POA: Diagnosis not present

## 2018-03-08 DIAGNOSIS — R1310 Dysphagia, unspecified: Secondary | ICD-10-CM

## 2018-03-08 LAB — CBC WITH DIFFERENTIAL/PLATELET
Basophils Absolute: 0.1 10*3/uL (ref 0.0–0.1)
Basophils Relative: 1.1 % (ref 0.0–3.0)
Eosinophils Absolute: 0.1 10*3/uL (ref 0.0–0.7)
Eosinophils Relative: 1.4 % (ref 0.0–5.0)
HCT: 41.4 % (ref 36.0–46.0)
Hemoglobin: 13.9 g/dL (ref 12.0–15.0)
Lymphocytes Relative: 33.8 % (ref 12.0–46.0)
Lymphs Abs: 1.5 10*3/uL (ref 0.7–4.0)
MCHC: 33.6 g/dL (ref 30.0–36.0)
MCV: 87.5 fl (ref 78.0–100.0)
Monocytes Absolute: 0.4 10*3/uL (ref 0.1–1.0)
Monocytes Relative: 9.9 % (ref 3.0–12.0)
Neutro Abs: 2.4 10*3/uL (ref 1.4–7.7)
Neutrophils Relative %: 53.8 % (ref 43.0–77.0)
Platelets: 340 10*3/uL (ref 150.0–400.0)
RBC: 4.74 Mil/uL (ref 3.87–5.11)
RDW: 13.4 % (ref 11.5–15.5)
WBC: 4.5 10*3/uL (ref 4.0–10.5)

## 2018-03-08 LAB — TSH: TSH: 1.25 u[IU]/mL (ref 0.35–4.50)

## 2018-03-08 LAB — COMPREHENSIVE METABOLIC PANEL
ALT: 17 U/L (ref 0–35)
AST: 17 U/L (ref 0–37)
Albumin: 4.3 g/dL (ref 3.5–5.2)
Alkaline Phosphatase: 48 U/L (ref 39–117)
BUN: 7 mg/dL (ref 6–23)
CO2: 25 mEq/L (ref 19–32)
Calcium: 9.6 mg/dL (ref 8.4–10.5)
Chloride: 106 mEq/L (ref 96–112)
Creatinine, Ser: 0.61 mg/dL (ref 0.40–1.20)
GFR: 126.17 mL/min (ref >60.00)
Glucose, Bld: 76 mg/dL (ref 70–99)
Potassium: 4.4 mEq/L (ref 3.5–5.1)
SODIUM: 139 meq/L (ref 135–145)
Total Bilirubin: 0.6 mg/dL (ref 0.2–1.2)
Total Protein: 7.2 g/dL (ref 6.0–8.3)

## 2018-03-08 LAB — IGA: IGA: 126 mg/dL (ref 68–378)

## 2018-03-08 MED ORDER — PANTOPRAZOLE SODIUM 40 MG PO TBEC: 40.0000 mg | DELAYED_RELEASE_TABLET | Freq: Every day | ORAL | 11 refills | Status: DC

## 2018-03-08 NOTE — Progress Notes (Signed)
HISTORY OF PRESENT ILLNESS:  Meghan Hayes is a 28 y.o. female, 2017 [employer] graduate and daughter of Meghan Hayes and Meghan Hayes, who is self-referred regarding evaluation of chronic abdominal complaints.  She was evaluated in this office November 2016 by Dr. Havery Hayes.  Evaluation at that time included an abdominal ultrasound which revealed a stable gallbladder polyp.  She has not been seen since.  She switched to my care as her parents are patients of mine.  In any event, she has been living in [place] until recently.  Apparently saw a gastroenterologist there who recommended colonoscopy.  They are interested in having this performed.  As well, the patient tells me that she is looking for alternative forms of medicine.  She tells me that she saw a kinesiologist who palpated her right lower quadrant (where she has chronic discomfort) and diagnosed "ileocecal valve syndrome".  In short, the patient has had abdominal complaints dating back to childhood.  She describes to me classic reflux symptoms as manifested by pyrosis.  She also describes intermittent globus sensation which worsened after receiving Omnicef this past November.  As well, intermittent solid food dysphasia items such as bread and rice over the past 2 years.  She has taken PPI therapy very sporadically.  Next, she describes problems with abdominal bloating and pain, generally on the right side of her abdomen, and constipation greater than diarrhea 80% of the time.  For constipation she has tried multiple agents including Dulcolax, MiraLAX, and more recently magnesium.  Her weight fluctuates but has been stable.  No bleeding.  No vomiting.  She describes herself as typically under stress.  She is accompanied today by her mother.  Patient also has a history of Hashimoto's thyroiditis.  Last TSH was normal.  Patient feels like she has intolerance to multiple foods.  She thinks she may have been tested for celiac disease but no record in our  office.  REVIEW OF SYSTEMS:  All non-GI ROS negative unless otherwise stated in the HPI except for skin rash, sleeping problems, sore throat  Past Medical History:  Diagnosis Date  . Allergy    seasonal, nuts, wheat, corn, soy  . Gallbladder polyp 2011   6 mm has had repeat imaging and stable.   Marland Kitchen GERD (gastroesophageal reflux disease)   . Hashimoto's disease 2014  . Ileocecal valve syndrome 2017   followed by dr. Havery Hayes  . Migraines   . Thyroid nodule 2014   last [place] 2016: 1.2cm thyroid nodule vs PTH adenoma.     Past Surgical History:  Procedure Laterality Date  . TONSILLECTOMY  2008  . WISDOM TOOTH EXTRACTION      Social History Meghan Hayes  reports that she has never smoked. She has never used smokeless tobacco. She reports that she does not drink alcohol or use drugs.  family history includes Allergies in her father; Colon cancer in her maternal grandmother; Hyperlipidemia in her father; Hypertension in her father, maternal grandmother, and paternal uncle.  Allergies  Allergen Reactions  . Corn Oil Anaphylaxis  . Dairy Aid  [Lactase] Swelling    All dairy products make intestines swell  . Omnicef [Cefdinir] Anaphylaxis  . Other Anaphylaxis  . Wheat Bran Anaphylaxis  . Eggs Or Egg-Derived Products     abd swelling  . Soy Allergy        PHYSICAL EXAMINATION: Vital signs: BP 100/60   Pulse 80   Ht 5\' 8"  (1.727 m)   Wt 124 lb 6.4 oz (56.4 kg)  LMP 02/06/2018   BMI 18.91 kg/m   Constitutional: generally well-appearing, no acute distress Psychiatric: alert and oriented x3, cooperative.  Somewhat anxious Eyes: extraocular movements intact, anicteric, conjunctiva pink Mouth: oral pharynx moist, no lesions Neck: supple no lymphadenopathy Cardiovascular: heart regular rate and rhythm, no murmur Lungs: clear to auscultation bilaterally Abdomen: soft, nontender, nondistended, no obvious ascites, no peritoneal signs, normal bowel sounds, no  organomegaly Rectal: Deferred until colonoscopy Extremities: no clubbing, cyanosis, or lower extremity edema bilaterally Skin: no lesions on visible extremities Neuro: No focal deficits.  Cranial nerves intact.  ASSESSMENT:  1.  Chronic GERD.  Significant symptoms when off PPI 2.  Intermittent solid food dysphasia.  Rule out peptic stricture.  Rule out EOE 3.  Globus sensation.  Rule out GERD.  Rule out functional 4.  Chronic right-sided abdominal pain.  Negative ultrasound except for incidental gallbladder polyp.  Rule out functional.  Rule out IBD.  Rule out Meckel's diverticulum 5.  Chronic constipation with bloating and occasional diarrhea.  Most consistent with constipation predominant IBS   PLAN:  1.  Prescribe pantoprazole 40 mg daily.  Advised to take daily until further notice 2.  Schedule upper endoscopy with possible esophageal dilation.The nature of the procedure, as well as the risks, benefits, and alternatives were carefully and thoroughly reviewed with the patient. Ample time for discussion and questions allowed. The patient understood, was satisfied, and agreed to proceed. 3.  Blood work today including CBC, comprehensive metabolic panel, TSH, celiac testing.  We will contact the patient with the results 4.  Schedule colonoscopy to evaluate difficulties with bowel habits, abdominal pain, and ileal evaluation.The nature of the procedure, as well as the risks, benefits, and alternatives were carefully and thoroughly reviewed with the patient. Ample time for discussion and questions allowed. The patient understood, was satisfied, and agreed to proceed. 5.  Potential therapeutic considerations include Linzess, fiber, Zoloft (pending the work-up)

## 2018-03-08 NOTE — Patient Instructions (Signed)
Your provider has requested that you go to the basement level for lab work before leaving today. Press "B" on the elevator. The lab is located at the first door on the left as you exit the elevator.  We have sent the following medications to your pharmacy for you to pick up at your convenience:  Pantoprazole  Please call back when you are ready to schedule your endoscopy/colonoscopy.

## 2018-03-09 LAB — TISSUE TRANSGLUTAMINASE, IGA: (tTG) Ab, IgA: 1 U/mL

## 2018-05-12 DIAGNOSIS — Z114 Encounter for screening for human immunodeficiency virus [HIV]: Secondary | ICD-10-CM | POA: Diagnosis not present

## 2018-05-12 DIAGNOSIS — Z1159 Encounter for screening for other viral diseases: Secondary | ICD-10-CM | POA: Diagnosis not present

## 2018-05-12 DIAGNOSIS — Z113 Encounter for screening for infections with a predominantly sexual mode of transmission: Secondary | ICD-10-CM | POA: Diagnosis not present

## 2018-05-12 DIAGNOSIS — Z681 Body mass index (BMI) 19 or less, adult: Secondary | ICD-10-CM | POA: Diagnosis not present

## 2018-05-12 DIAGNOSIS — Z01419 Encounter for gynecological examination (general) (routine) without abnormal findings: Secondary | ICD-10-CM | POA: Diagnosis not present

## 2018-05-12 DIAGNOSIS — Z118 Encounter for screening for other infectious and parasitic diseases: Secondary | ICD-10-CM | POA: Diagnosis not present

## 2018-05-12 DIAGNOSIS — E039 Hypothyroidism, unspecified: Secondary | ICD-10-CM | POA: Diagnosis not present

## 2018-11-01 ENCOUNTER — Encounter: Payer: Self-pay | Admitting: Internal Medicine

## 2018-11-18 ENCOUNTER — Other Ambulatory Visit: Payer: Self-pay

## 2018-11-18 ENCOUNTER — Ambulatory Visit (AMBULATORY_SURGERY_CENTER): Payer: Self-pay | Admitting: *Deleted

## 2018-11-18 VITALS — Temp 96.9°F | Ht 68.0 in | Wt 123.0 lb

## 2018-11-18 DIAGNOSIS — R1310 Dysphagia, unspecified: Secondary | ICD-10-CM

## 2018-11-18 DIAGNOSIS — K219 Gastro-esophageal reflux disease without esophagitis: Secondary | ICD-10-CM

## 2018-11-18 DIAGNOSIS — K5900 Constipation, unspecified: Secondary | ICD-10-CM

## 2018-11-18 DIAGNOSIS — R109 Unspecified abdominal pain: Secondary | ICD-10-CM

## 2018-11-18 MED ORDER — NA SULFATE-K SULFATE-MG SULF 17.5-3.13-1.6 GM/177ML PO SOLN: ORAL | 0 refills | Status: DC

## 2018-11-18 NOTE — Progress Notes (Addendum)
Patient is here in-person for PV. Patient is allergic to eggs or soy. Patient did have crying and panic attack with sedation. Patient denies any oxygen use at home. Patient denies taking any diet/weight loss medications or blood thinners. Patient is not being treated for MRSA or C-diff. EMMI education assisgned to patient on colonoscopy, this was explained and instructions given to patient. Patient has Suprep at home. During [place] Mother is with patient (temp97.3).   Pt and mother is aware that care partner will wait in the car during procedure; if they feel like they will be too hot to wait in the car; they may wait in the lobby. Patient is aware to bring only one care partner. We want them to wear a mask (we do not have any that we can provide them), practice social distancing, and we will check their temperatures when they get here.  I did remind patient that their care partner needs to stay in the parking lot the entire time. Pt will wear mask into building.

## 2018-11-21 ENCOUNTER — Encounter: Payer: Self-pay | Admitting: Internal Medicine

## 2018-12-01 ENCOUNTER — Telehealth: Payer: Self-pay

## 2018-12-01 NOTE — Telephone Encounter (Signed)
Covid-19 screening questions   Do you now or have you had a fever in the last 14 days?  Do you have any respiratory symptoms of shortness of breath or cough now or in the last 14 days?  Do you have any family members or close contacts with diagnosed or suspected Covid-19 in the past 14 days?  Have you been tested for Covid-19 and found to be positive?       

## 2018-12-02 ENCOUNTER — Encounter: Payer: Self-pay | Admitting: Internal Medicine

## 2018-12-02 ENCOUNTER — Ambulatory Visit (AMBULATORY_SURGERY_CENTER): Payer: 59 | Admitting: Internal Medicine

## 2018-12-02 ENCOUNTER — Other Ambulatory Visit: Payer: Self-pay

## 2018-12-02 VITALS — BP 114/76 | HR 94 | Temp 98.6°F | Resp 12 | Ht 68.0 in | Wt 123.0 lb

## 2018-12-02 DIAGNOSIS — R194 Change in bowel habit: Secondary | ICD-10-CM | POA: Diagnosis not present

## 2018-12-02 DIAGNOSIS — K219 Gastro-esophageal reflux disease without esophagitis: Secondary | ICD-10-CM | POA: Diagnosis present

## 2018-12-02 DIAGNOSIS — K5900 Constipation, unspecified: Secondary | ICD-10-CM

## 2018-12-02 DIAGNOSIS — R1310 Dysphagia, unspecified: Secondary | ICD-10-CM

## 2018-12-02 DIAGNOSIS — R1084 Generalized abdominal pain: Secondary | ICD-10-CM

## 2018-12-02 DIAGNOSIS — G8929 Other chronic pain: Secondary | ICD-10-CM

## 2018-12-02 DIAGNOSIS — R1031 Right lower quadrant pain: Secondary | ICD-10-CM

## 2018-12-02 MED ORDER — SODIUM CHLORIDE 0.9 % IV SOLN: 500.0000 mL | Freq: Once | INTRAVENOUS | Status: DC

## 2018-12-02 NOTE — Progress Notes (Signed)
PT taken to PACU. Monitors in place. VSS. Report given to RN. 

## 2018-12-02 NOTE — Patient Instructions (Signed)
Impression/Recommendations:  Continue pantoprazole daily for acid reflux. Start fiber supplementation with Metamucil 1-2 Tablespoons daily in 12-14 oz. Of water or juice.  Schedule Meckel's scan to rule out Meckel's diverticulum.  Follow-up in 4-6 weeks with Dr. Henrene Pastor in the office.  Resume previous diet. Continue present medications.  YOU HAD AN ENDOSCOPIC PROCEDURE TODAY AT [facility]:   Refer to the procedure report that was given to you for any specific questions about what was found during the examination.  If the procedure report does not answer your questions, please call your gastroenterologist to clarify.  If you requested that your care partner not be given the details of your procedure findings, then the procedure report has been included in a sealed envelope for you to review at your convenience later.  YOU SHOULD EXPECT: Some feelings of bloating in the abdomen. Passage of more gas than usual.  Walking can help get rid of the air that was put into your GI tract during the procedure and reduce the bloating. If you had a lower endoscopy (such as a colonoscopy or flexible sigmoidoscopy) you may notice spotting of blood in your stool or on the toilet paper. If you underwent a bowel prep for your procedure, you may not have a normal bowel movement for a few days.  Please Note:  You might notice some irritation and congestion in your nose or some drainage.  This is from the oxygen used during your procedure.  There is no need for concern and it should clear up in a day or so.  SYMPTOMS TO REPORT IMMEDIATELY:   Following lower endoscopy (colonoscopy or flexible sigmoidoscopy):  Excessive amounts of blood in the stool  Significant tenderness or worsening of abdominal pains  Swelling of the abdomen that is new, acute  Fever of 100F or higher   Following upper endoscopy (EGD)  Vomiting of blood or coffee ground material  New chest pain or pain under the shoulder  blades  Painful or persistently difficult swallowing  New shortness of breath  Fever of 100F or higher  Black, tarry-looking stools  For urgent or emergent issues, a gastroenterologist can be reached at any hour by calling [phone].   DIET:  We do recommend a small meal at first, but then you may proceed to your regular diet.  Drink plenty of fluids but you should avoid alcoholic beverages for 24 hours.  ACTIVITY:  You should plan to take it easy for the rest of today and you should NOT DRIVE or use heavy machinery until tomorrow (because of the sedation medicines used during the test).    FOLLOW UP: Our staff will call the number listed on your records 48-72 hours following your procedure to check on you and address any questions or concerns that you may have regarding the information given to you following your procedure. If we do not reach you, we will leave a message.  We will attempt to reach you two times.  During this call, we will ask if you have developed any symptoms of COVID 19. If you develop any symptoms (ie: fever, flu-like symptoms, shortness of breath, cough etc.) before then, please call [phone].  If you test positive for Covid 19 in the 2 weeks post procedure, please call and report this information to [place].    If any biopsies were taken you will be contacted by phone or by letter within the next 1-3 weeks.  Please call us at [phone] if you have  not heard about the biopsies in 3 weeks.    SIGNATURES/CONFIDENTIALITY: You and/or your care partner have signed paperwork which will be entered into your electronic medical record.  These signatures attest to the fact that that the information above on your After Visit Summary has been reviewed and is understood.  Full responsibility of the confidentiality of this discharge information lies with you and/or your care-partner.

## 2018-12-02 NOTE — Op Note (Signed)
[place] Patient Name: Meghan Hayes Procedure Date: 12/02/2018 8:02 AM MRN: [id number] Endoscopist: Docia Chuck. Henrene Pastor , MD Age: 28 Referring MD:  Date of Birth: 03/11/1990 Gender: Female Account #: [id number] Procedure:                Colonoscopy Indications:              Abdominal pain in the right lower quadrant, Change                            in bowel habits Medicines:                Monitored Anesthesia Care Procedure:                Pre-Anesthesia Assessment:                           - Prior to the procedure, a History and Physical                            was performed, and patient medications and                            allergies were reviewed. The patient's tolerance of                            previous anesthesia was also reviewed. The risks                            and benefits of the procedure and the sedation                            options and risks were discussed with the patient.                            All questions were answered, and informed consent                            was obtained. Prior Anticoagulants: The patient has                            taken no previous anticoagulant or antiplatelet                            agents. ASA Grade Assessment: I - A normal, healthy                            patient. After reviewing the risks and benefits,                            the patient was deemed in satisfactory condition to                            undergo the procedure.  After obtaining informed consent, the colonoscope                            was passed under direct vision. Throughout the                            procedure, the patient's blood pressure, pulse, and                            oxygen saturations were monitored continuously. The                            Colonoscope was introduced through the anus and                            advanced to the the cecum, identified by                             appendiceal orifice and ileocecal valve. The                            terminal ileum, ileocecal valve, appendiceal                            orifice, and rectum were photographed. The quality                            of the bowel preparation was excellent. The                            colonoscopy was performed without difficulty. The                            patient tolerated the procedure well. The bowel                            preparation used was SUPREP via split dose                            instruction. Scope In: 8:14:35 AM Scope Out: 8:25:42 AM Scope Withdrawal Time: 0 hours 6 minutes 32 seconds  Total Procedure Duration: 0 hours 11 minutes 7 seconds  Findings:                 The terminal ileum appeared normal.                           The entire examined colon appeared normal on direct                            and retroflexion views. Complications:            No immediate complications. Estimated blood loss:                            None. Estimated Blood  Loss:     Estimated blood loss: none. Impression:               - The examined portion of the ileum was normal.                           - The entire examined colon is normal on direct and                            retroflexion views.                           - No specimens collected. Recommendation:           - Repeat colonoscopy at age 84 for screening                            purposes.                           - Patient has a contact number available for                            emergencies. The signs and symptoms of potential                            delayed complications were discussed with the                            patient. Return to normal activities tomorrow.                            Written discharge instructions were provided to the                            patient.                           - Resume previous diet.                           - Continue present  medications.                           - EGD today. Please see report regarding findings                            and final recommendations John N. Henrene Pastor, MD 12/02/2018 8:47:27 AM This report has been signed electronically.

## 2018-12-02 NOTE — Progress Notes (Signed)
Pt. Teary, feeling some dizziness, diplopia.  Brought pt.'s mother to [facility] to be with pt. And wait for Dr. Henrene Pastor to return after next procedure.   Hence delayed discharge time.

## 2018-12-02 NOTE — Op Note (Signed)
[place] Patient Name: Meghan Hayes Procedure Date: 12/02/2018 8:02 AM MRN: [id number] Endoscopist: Docia Chuck. Henrene Pastor , MD Age: 28 Referring MD:  Date of Birth: 02/18/1991 Gender: Female Account #: [id number] Procedure:                Upper GI endoscopy Indications:              Abdominal pain, Dysphagia, Esophageal reflux Medicines:                Monitored Anesthesia Care Procedure:                Pre-Anesthesia Assessment:                           - Prior to the procedure, a History and Physical                            was performed, and patient medications and                            allergies were reviewed. The patient's tolerance of                            previous anesthesia was also reviewed. The risks                            and benefits of the procedure and the sedation                            options and risks were discussed with the patient.                            All questions were answered, and informed consent                            was obtained. Prior Anticoagulants: The patient has                            taken no previous anticoagulant or antiplatelet                            agents. ASA Grade Assessment: I - A normal, healthy                            patient. After reviewing the risks and benefits,                            the patient was deemed in satisfactory condition to                            undergo the procedure.                           After obtaining informed consent, the endoscope was  passed under direct vision. Throughout the                            procedure, the patient's blood pressure, pulse, and                            oxygen saturations were monitored continuously. The                            Endoscope was introduced through the mouth, and                            advanced to the second part of duodenum. The upper                            GI endoscopy was  accomplished without difficulty.                            The patient tolerated the procedure well. Scope In: Scope Out: Findings:                 The esophagus was normal.                           The stomach was normal.                           The examined duodenum was normal.                           The cardia and gastric fundus were normal on                            retroflexion. Complications:            No immediate complications. Estimated Blood Loss:     Estimated blood loss: none. Impression:               1. Normal EGD                           2. GERD.                           3. Chronic right lower quadrant pain Recommendation:           1. Continue pantoprazole daily for acid reflux                           2. Start fiber supplementation with Metamucil 1 to                            2 tablespoons daily in 12 to 14 ounces of water or                            juice. This will help your bowel irregularities  3. Schedule Meckel's scan "chronic right lower                            quadrant pain, rule out Meckel's diverticulum"                           4. Follow-up in the office with Dr. Henrene Pastor in 4 to 6                            weeks. Docia Chuck. Henrene Pastor, MD 12/02/2018 8:50:48 AM This report has been signed electronically.

## 2018-12-02 NOTE — Progress Notes (Signed)
Temp by JB and vitals by [place]

## 2018-12-05 ENCOUNTER — Telehealth: Payer: Self-pay

## 2018-12-05 ENCOUNTER — Other Ambulatory Visit: Payer: Self-pay

## 2018-12-05 DIAGNOSIS — G8929 Other chronic pain: Secondary | ICD-10-CM

## 2018-12-05 DIAGNOSIS — R1031 Right lower quadrant pain: Secondary | ICD-10-CM

## 2018-12-05 NOTE — Telephone Encounter (Signed)
Pt scheduled for meckels scan at [facility] 10/21 @9 :30am, pt to arrive there at 9:15am. No prep needed. Pt scheduled to see Dr. Henrene Pastor 01/13/19@3 :40pm. Attempted to call pt with app. Received message that "unable to reach pt voicemail box full." Will try again.

## 2018-12-06 ENCOUNTER — Telehealth: Payer: Self-pay | Admitting: *Deleted

## 2018-12-06 ENCOUNTER — Telehealth: Payer: Self-pay

## 2018-12-06 NOTE — Telephone Encounter (Signed)
Follow up call made.  NA, unable to leave message d/t full mailbox.

## 2018-12-06 NOTE — Telephone Encounter (Signed)
Left message for pt to call back  °

## 2018-12-06 NOTE — Telephone Encounter (Signed)
Unable to leave message on f/u call (mailbox full)

## 2018-12-13 NOTE — Telephone Encounter (Signed)
After attempted to call pt several times and leaving a message on her mothers phone to call back, have been unable to reach anyone. Left detailed message on  Mothers cell phone with appt information for scan and OV. Requested a call back to confirm that she got the message.

## 2018-12-14 ENCOUNTER — Ambulatory Visit (HOSPITAL_COMMUNITY): Admission: RE | Admit: 2018-12-14 | Payer: 59 | Source: Ambulatory Visit

## 2018-12-31 IMAGING — DX DG LUMBAR SPINE COMPLETE W/ BEND
7 series · 7 of 7 positions shown · non-contrast
Comparison: None.

CLINICAL DATA: Right low back pain for several weeks. Right lumbar
radiculitis.

EXAM:
LUMBAR SPINE - COMPLETE WITH BENDING VIEWS

[l-spine ap]
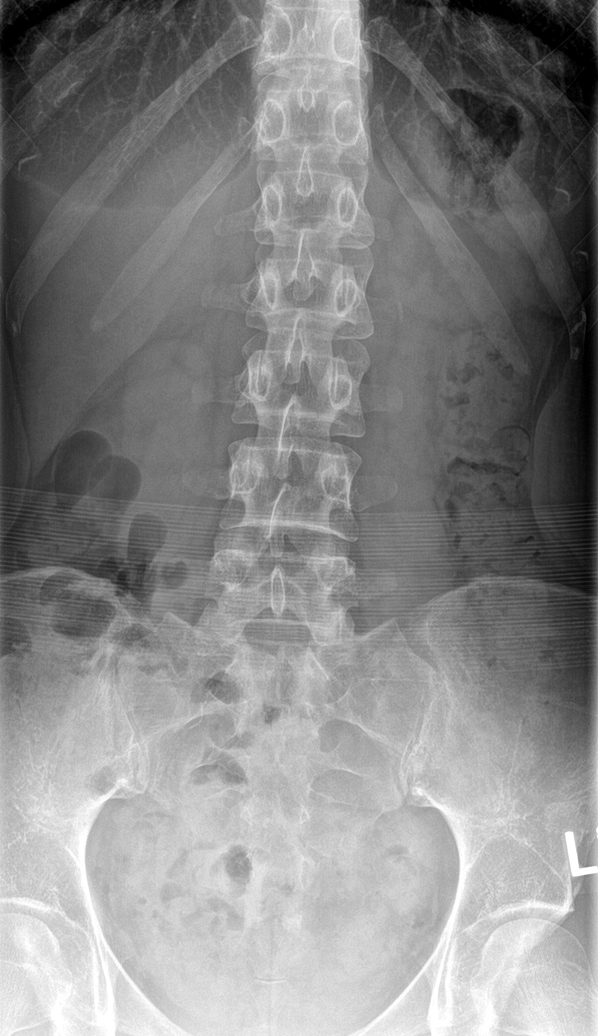

[l-spine obl (1 of 2)]
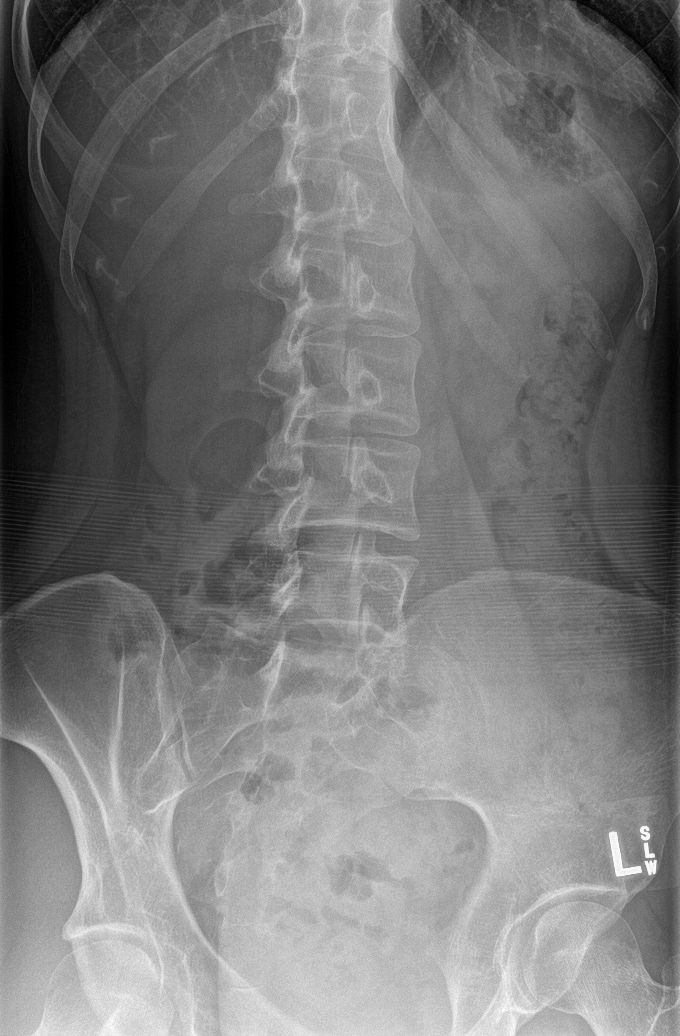

[l-spine obl (2 of 2)]
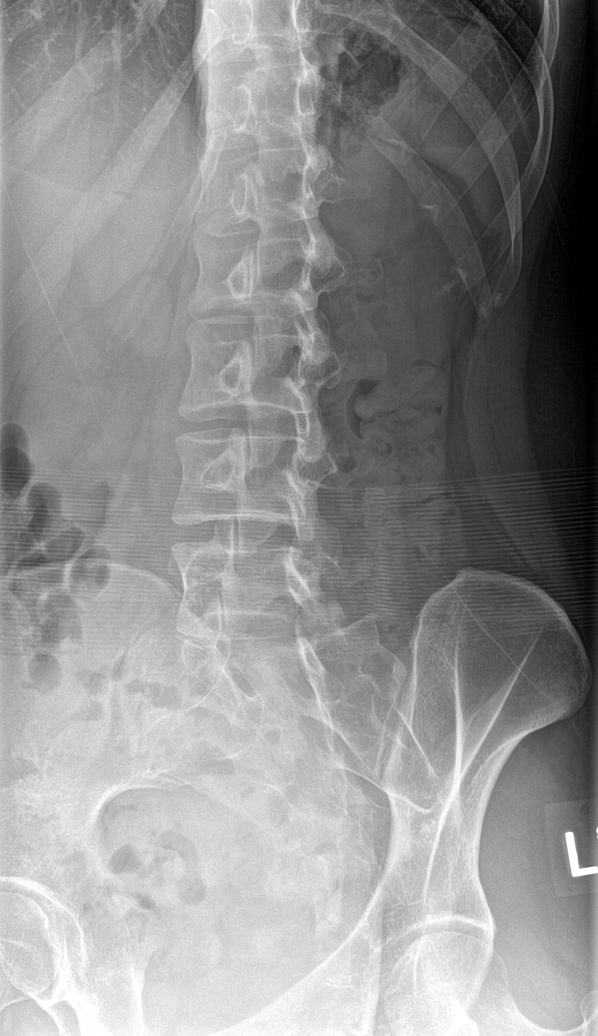

[l-spine lat]
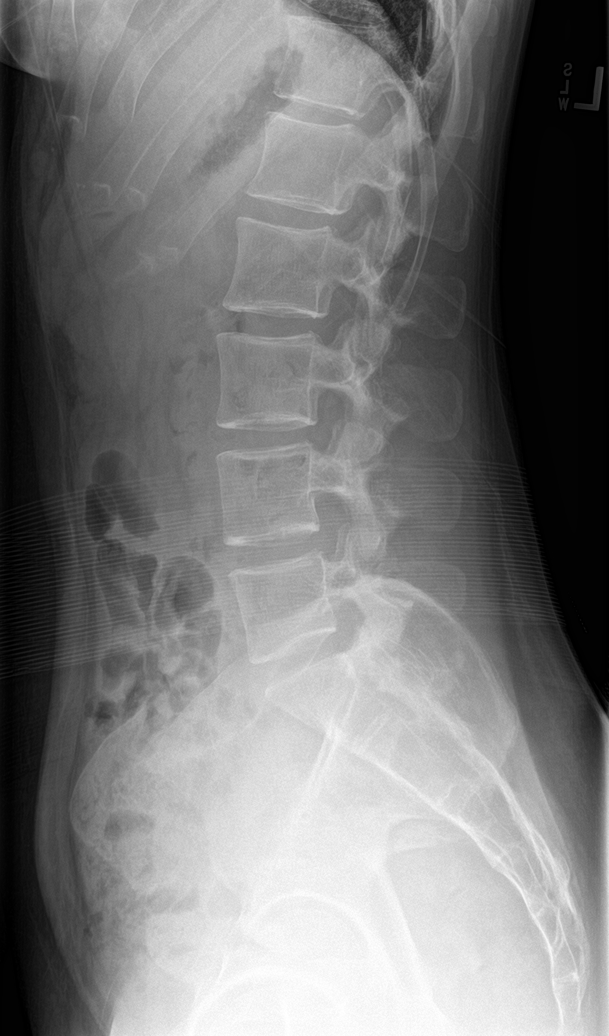

[l-spine flex]
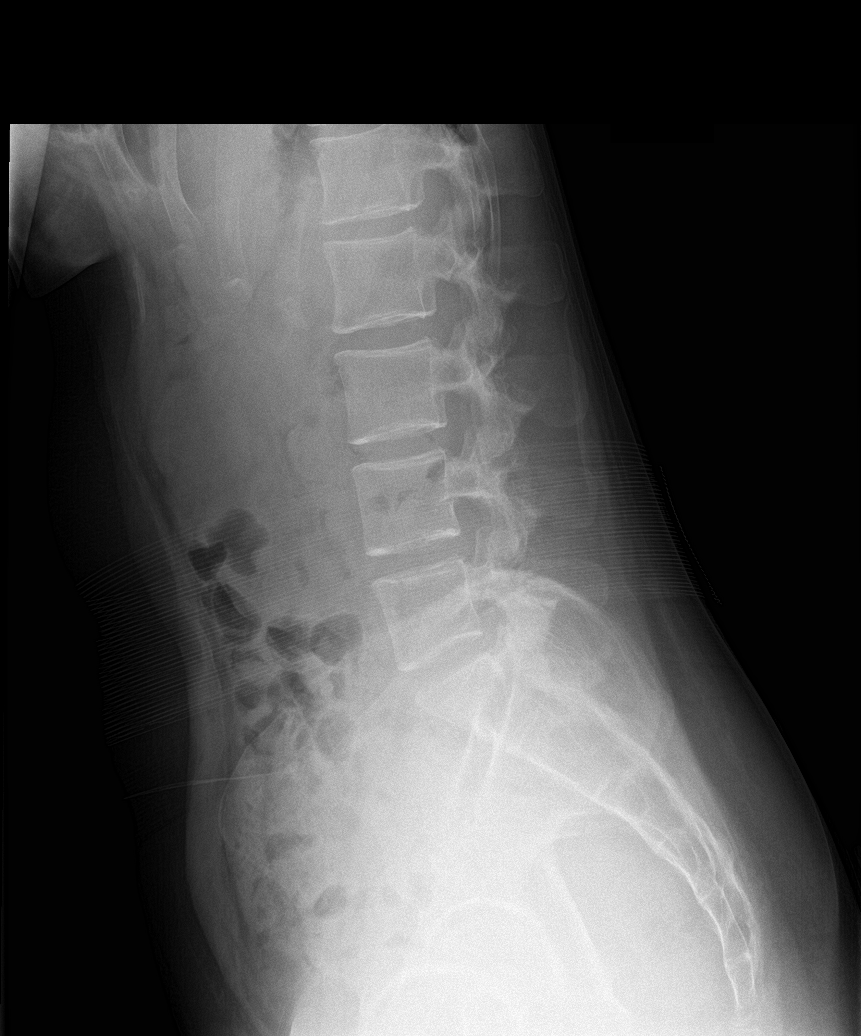

[l-spine ext]
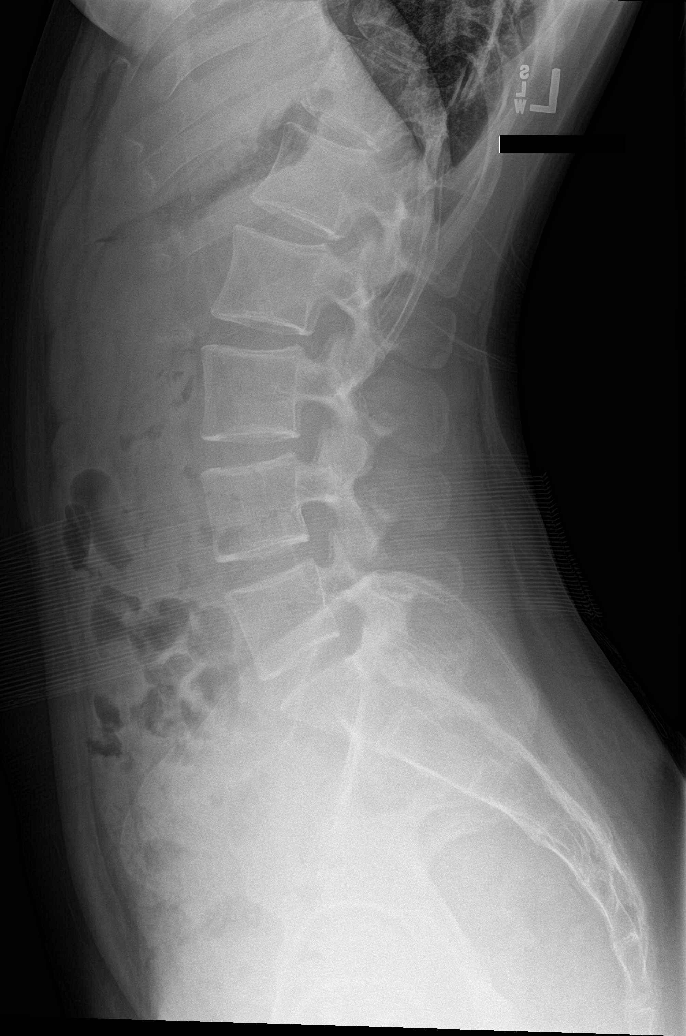

[l-spine spot]
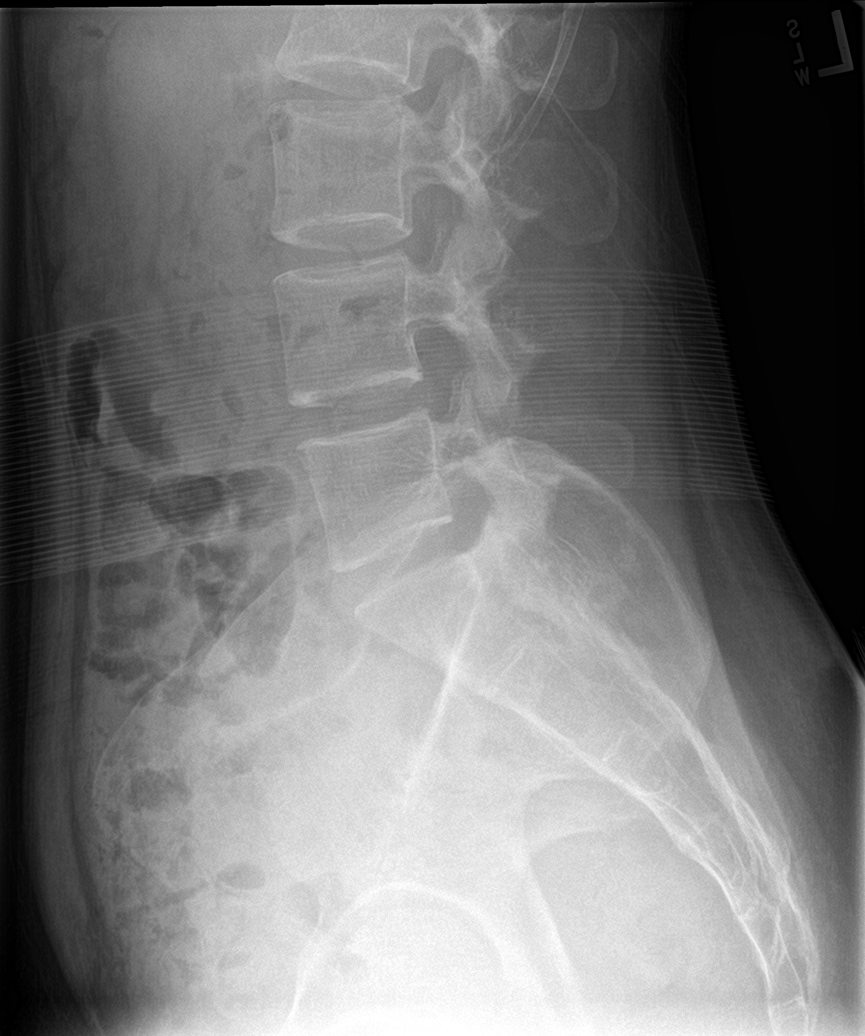

[7 of 7 positions shown; findings below may reference images not displayed]

FINDINGS: There is no evidence of lumbar spine fracture. Alignment is normal.
Intervertebral disc spaces are maintained.
IMPRESSION: Negative.

## 2019-01-13 ENCOUNTER — Ambulatory Visit: Payer: 59 | Admitting: Internal Medicine

## 2019-01-31 ENCOUNTER — Ambulatory Visit (INDEPENDENT_AMBULATORY_CARE_PROVIDER_SITE_OTHER): Payer: 59 | Admitting: Family Medicine

## 2019-01-31 ENCOUNTER — Encounter: Payer: Self-pay | Admitting: Family Medicine

## 2019-01-31 ENCOUNTER — Other Ambulatory Visit: Payer: Self-pay

## 2019-01-31 VITALS — Temp 97.6°F | Ht 68.0 in | Wt 118.1 lb

## 2019-01-31 DIAGNOSIS — E559 Vitamin D deficiency, unspecified: Secondary | ICD-10-CM

## 2019-01-31 DIAGNOSIS — R5383 Other fatigue: Secondary | ICD-10-CM

## 2019-01-31 DIAGNOSIS — E041 Nontoxic single thyroid nodule: Secondary | ICD-10-CM

## 2019-01-31 DIAGNOSIS — E063 Autoimmune thyroiditis: Secondary | ICD-10-CM

## 2019-01-31 DIAGNOSIS — M2550 Pain in unspecified joint: Secondary | ICD-10-CM

## 2019-01-31 MED ORDER — EPINEPHRINE 0.3 MG/0.3ML IJ SOAJ: INTRAMUSCULAR | 1 refills | Status: AC

## 2019-01-31 NOTE — Progress Notes (Signed)
VIRTUAL VISIT VIA VIDEO  I connected with Meghan Hayes on 01/31/19 at  1:00 PM EST by a video enabled telemedicine application and verified that I am speaking with the correct person using two identifiers. Location patient: Home Location provider: [facility], Office Persons participating in the virtual visit: Patient, Dr. Raoul Pitch and R.Baker, LPN  I discussed the limitations of evaluation and management by telemedicine and the availability of in person appointments. The patient expressed understanding and agreed to proceed. Patient Care Team    Relationship Specialty Notifications Start End  Ma Hillock, DO PCP - General Family Medicine  04/14/16   Philemon Kingdom, MD Consulting Physician Internal Medicine  04/14/16    Comment: endocrine  Armbruster, Carlota Raspberry, MD Consulting Physician Gastroenterology  04/14/16   Aloha Gell, MD Consulting Physician Obstetrics and Gynecology  04/14/16      SUBJECTIVE Chief Complaint  Patient presents with  . Follow-up    Thyroid. Pt is experiencing soreness, fatigue, headaches, and nausea. These are all symptoms she has when her thyroid is off     HPI: Meghan Hayes is a 28 y.o. female present for 3 to 62-month onset of increasing fatigue, arthralgias, headaches and nausea.  She states these are the symptoms she had when her thyroid was not well supplemented.  She has been seen at an endocrine at [facility], records not available.  She is still on the same dose of NP thyroid 30 mg daily-prescribed by this provider last year.  She states she has not had a repeat ultrasound of her thyroid nodules, but believes she is overdue.  ROS: See pertinent positives and negatives per HPI.  Patient Active Problem List   Diagnosis Date Noted  . LAD (lymphadenopathy) of right cervical region 01/18/2018  . Pain of right mastoid 01/18/2018  . Right lumbar radiculitis 07/20/2017  . Patellofemoral syndrome, left 07/06/2017  . Tibialis posterior tendinitis  08/10/2016  . Vitamin D deficiency 04/14/2016  . Throat pain 04/14/2016  . Thyroid nodule 04/14/2016  . GERD (gastroesophageal reflux disease)   . Hashimoto's thyroiditis 07/26/2012    Social History   Tobacco Use  . Smoking status: Never Smoker  . Smokeless tobacco: Never Used  Substance Use Topics  . Alcohol use: No    Current Outpatient Medications:  .  EPINEPHrine 0.3 mg/0.3 mL IJ SOAJ injection, INJECT 0.3 MLS (0.3 MG TOTAL) INTO THE MUSCLE ONCE AS NEEDED FOR ANAPHYLAXIS., Disp: 1 Device, Rfl: 1 .  levocetirizine (XYZAL) 5 MG tablet, Take 1 tablet (5 mg total) by mouth every evening., Disp: 90 tablet, Rfl: 3 .  MAGNESIUM MALATE PO, Take 3.75 mg by mouth daily. 2 pills daily, Disp: , Rfl:  .  NP THYROID 30 MG tablet, Take 1 tablet (30 mg total) by mouth daily., Disp: 90 tablet, Rfl: 3 .  pantoprazole (PROTONIX) 40 MG tablet, Take 1 tablet (40 mg total) by mouth daily., Disp: 30 tablet, Rfl: 11 .  fluticasone (FLONASE) 50 MCG/ACT nasal spray, Place 2 sprays into both nostrils daily. (Patient not taking: Reported on 01/31/2019), Disp: 16 g, Rfl: 6  Allergies  Allergen Reactions  . Cefdinir Anaphylaxis, Rash and Swelling    throat  . Corn Oil Anaphylaxis  . Gluten Meal Anaphylaxis  . Lactase Swelling    All dairy products make intestines swell All dairy products make intestines swell All dairy products make intestines swell  . Other Anaphylaxis and Swelling    All dairy products make intestines swell abd swelling All nuts  .  Wheat Bran Anaphylaxis  . Eggs Or Egg-Derived Products     abd swelling  . Soy Allergy     OBJECTIVE: Temp 97.6 F (36.4 C) (Oral)   Ht 5\' 8"  (1.727 m)   Wt 118 lb 2 oz (53.6 kg)   LMP 01/17/2019 (Exact Date)   BMI 17.96 kg/m  Gen: No acute distress. Nontoxic in appearance.  HENT: AT. [place].  MMM.  Eyes:Pupils Equal Round Reactive to light, Extraocular movements intact,  Conjunctiva without redness, discharge or icterus. CV: no edema Chest:  Cough or shortness of breath not present Skin: No rashes, purpura or petechiae.  Neuro:  Alert. Oriented x3  Psych: Normal affect, dress and demeanor. Normal speech. Normal thought content and judgment.  ASSESSMENT AND PLAN: Meghan Hayes is a 28 y.o. female present for  Hashimoto's thyroiditis/thyroid nodule Thyroid panel and thyroid ultrasound ordered today.  Medications will be refilled after results received. - TSH; Future - T4, free; Future - T3, free; Future - Thyroid peroxidase antibody; Future -Thyroid ultrasound Future  Arthralgia, unspecified joint/vitamin D deficiency/fatigue Arthralgia possibly related to thyroid disorder versus her vitamin D deficiency.  Will arrange for both to be checked by lab appointment only. 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  Refilled this for her today  Orders Placed This Encounter  Procedures  . US THYROID  . TSH  . T4, free  . T3, free  . Vitamin D (25 hydroxy)  . Thyroid peroxidase antibody    > 25 minutes spent with patient, >50% of time spent face to face     Howard Pouch, DO 01/31/2019

## 2019-02-03 ENCOUNTER — Other Ambulatory Visit: Payer: Self-pay

## 2019-02-03 ENCOUNTER — Ambulatory Visit (INDEPENDENT_AMBULATORY_CARE_PROVIDER_SITE_OTHER): Payer: 59 | Admitting: Family Medicine

## 2019-02-03 DIAGNOSIS — M2550 Pain in unspecified joint: Secondary | ICD-10-CM

## 2019-02-03 DIAGNOSIS — E063 Autoimmune thyroiditis: Secondary | ICD-10-CM

## 2019-02-03 DIAGNOSIS — E559 Vitamin D deficiency, unspecified: Secondary | ICD-10-CM

## 2019-02-03 DIAGNOSIS — R5383 Other fatigue: Secondary | ICD-10-CM

## 2019-02-03 DIAGNOSIS — E041 Nontoxic single thyroid nodule: Secondary | ICD-10-CM

## 2019-02-06 ENCOUNTER — Telehealth: Payer: Self-pay | Admitting: Family Medicine

## 2019-02-06 LAB — TSH: TSH: 1.24 mIU/L

## 2019-02-06 LAB — THYROID PEROXIDASE ANTIBODY: Thyroperoxidase Ab SerPl-aCnc: 123 IU/mL — ABNORMAL HIGH (ref <9)

## 2019-02-06 LAB — T3, FREE: T3, Free: 3.2 pg/mL (ref 2.3–4.2)

## 2019-02-06 LAB — VITAMIN D 25 HYDROXY (VIT D DEFICIENCY, FRACTURES): Vit D, 25-Hydroxy: 24 ng/mL — ABNORMAL LOW (ref 30–100)

## 2019-02-06 LAB — T4, FREE: Free T4: 1 ng/dL (ref 0.8–1.8)

## 2019-02-06 MED ORDER — NP THYROID 30 MG PO TABS: ORAL_TABLET | ORAL | 3 refills | Status: DC

## 2019-02-06 NOTE — Telephone Encounter (Signed)
Please inform patient the following information: -Her vitamin D is low at 24.>>>  Would encourage her to start taking 400 to 600 units of vitamin D daily with food.  This can cause fatigue and arthralgias. -Her TSH, T4, T3 are normal range.  Her thyroid antibodies are elevated at 123.>>  I slightly increased her total weekly dose to accommodate for the elevated thyroid antibodies. The dose for pill will stay the same however how she takes the medication will change slightly.  She will take 1 tab daily for 6 days a week and 1.5 tabs 1 day a week.  This has been called into her pharmacy. Follow-up in 10-12 weeks with provider and repeat labs.

## 2019-02-07 NOTE — Telephone Encounter (Signed)
Pt was called and message was left to return call  

## 2019-02-08 ENCOUNTER — Ambulatory Visit
Admission: RE | Admit: 2019-02-08 | Discharge: 2019-02-08 | Disposition: A | Discharge status: Home / Self Care | Payer: 59 | Source: Ambulatory Visit | Attending: Family Medicine | Admitting: Family Medicine

## 2019-02-08 DIAGNOSIS — E041 Nontoxic single thyroid nodule: Secondary | ICD-10-CM

## 2019-02-08 NOTE — Telephone Encounter (Signed)
Pt was called and given information/instructions. She verbalized understanding and was sent text to set up my chart, per patients request

## 2019-02-09 ENCOUNTER — Telehealth: Payer: Self-pay | Admitting: Family Medicine

## 2019-02-09 NOTE — Telephone Encounter (Signed)
Patient returning Lisa's call about her [place] results.  Please call patient at [phone].

## 2019-02-20 ENCOUNTER — Other Ambulatory Visit: Payer: 59

## 2019-02-24 HISTORY — PX: WISDOM TOOTH EXTRACTION: SHX21

## 2019-05-29 ENCOUNTER — Other Ambulatory Visit: Payer: Self-pay | Admitting: Internal Medicine

## 2019-06-15 ENCOUNTER — Other Ambulatory Visit: Payer: Self-pay

## 2019-06-15 ENCOUNTER — Encounter (INDEPENDENT_AMBULATORY_CARE_PROVIDER_SITE_OTHER): Payer: Self-pay

## 2019-06-15 ENCOUNTER — Ambulatory Visit (INDEPENDENT_AMBULATORY_CARE_PROVIDER_SITE_OTHER): Payer: 59 | Admitting: Physician Assistant

## 2019-06-15 ENCOUNTER — Encounter: Payer: Self-pay | Admitting: Physician Assistant

## 2019-06-15 DIAGNOSIS — D1801 Hemangioma of skin and subcutaneous tissue: Secondary | ICD-10-CM

## 2019-06-15 DIAGNOSIS — D489 Neoplasm of uncertain behavior, unspecified: Secondary | ICD-10-CM

## 2019-06-15 DIAGNOSIS — D485 Neoplasm of uncertain behavior of skin: Secondary | ICD-10-CM

## 2019-06-15 NOTE — Patient Instructions (Signed)

## 2019-06-15 NOTE — Progress Notes (Addendum)
   Follow-Up Visit   Subjective  Meghan Hayes is a 29 y.o. female who presents for the following: Skin Problem (wants angioma removed from  Right cheek & right forearm-  right cheek micro-cautery last OV and never went away). Slightly larger lesion- red dot x years. No symptoms. No bleeding    The following portions of the chart were reviewed this encounter and updated as appropriate: Tobacco  Allergies  Meds  Problems  Med Hx  Surg Hx  Fam Hx      Objective  Well appearing patient in no apparent distress; mood and affect are within normal limits.  A focused examination was performed including face and arms. Relevant physical exam findings are noted in the Assessment and Plan.  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

## 2019-08-05 NOTE — Addendum Note (Signed)
Addended by: Robyne Askew R on: 08/05/2019 03:53 PM   Modules accepted: Level of Service

## 2019-08-24 ENCOUNTER — Other Ambulatory Visit: Payer: Self-pay | Admitting: Internal Medicine

## 2019-09-29 DIAGNOSIS — R5383 Other fatigue: Secondary | ICD-10-CM | POA: Diagnosis not present

## 2019-09-29 DIAGNOSIS — E569 Vitamin deficiency, unspecified: Secondary | ICD-10-CM | POA: Diagnosis not present

## 2019-09-29 DIAGNOSIS — E7211 Homocystinuria: Secondary | ICD-10-CM | POA: Diagnosis not present

## 2019-09-29 DIAGNOSIS — E559 Vitamin D deficiency, unspecified: Secondary | ICD-10-CM | POA: Diagnosis not present

## 2019-09-29 DIAGNOSIS — E611 Iron deficiency: Secondary | ICD-10-CM | POA: Diagnosis not present

## 2019-09-29 DIAGNOSIS — E063 Autoimmune thyroiditis: Secondary | ICD-10-CM | POA: Diagnosis not present

## 2019-09-29 DIAGNOSIS — R21 Rash and other nonspecific skin eruption: Secondary | ICD-10-CM | POA: Diagnosis not present

## 2019-10-09 DIAGNOSIS — E618 Deficiency of other specified nutrient elements: Secondary | ICD-10-CM | POA: Diagnosis not present

## 2019-10-09 DIAGNOSIS — R790 Abnormal level of blood mineral: Secondary | ICD-10-CM | POA: Insufficient documentation

## 2019-10-09 DIAGNOSIS — R778 Other specified abnormalities of plasma proteins: Secondary | ICD-10-CM | POA: Diagnosis not present

## 2019-10-09 DIAGNOSIS — E7211 Homocystinuria: Secondary | ICD-10-CM | POA: Diagnosis not present

## 2019-10-09 DIAGNOSIS — M2550 Pain in unspecified joint: Secondary | ICD-10-CM | POA: Diagnosis not present

## 2019-10-13 DIAGNOSIS — Z20822 Contact with and (suspected) exposure to covid-19: Secondary | ICD-10-CM | POA: Diagnosis not present

## 2019-10-21 DIAGNOSIS — Z20822 Contact with and (suspected) exposure to covid-19: Secondary | ICD-10-CM | POA: Diagnosis not present

## 2019-12-14 DIAGNOSIS — G4700 Insomnia, unspecified: Secondary | ICD-10-CM | POA: Diagnosis not present

## 2019-12-14 DIAGNOSIS — G479 Sleep disorder, unspecified: Secondary | ICD-10-CM | POA: Diagnosis not present

## 2019-12-14 DIAGNOSIS — E063 Autoimmune thyroiditis: Secondary | ICD-10-CM | POA: Diagnosis not present

## 2020-01-30 DIAGNOSIS — N926 Irregular menstruation, unspecified: Secondary | ICD-10-CM | POA: Diagnosis not present

## 2020-01-30 DIAGNOSIS — Z113 Encounter for screening for infections with a predominantly sexual mode of transmission: Secondary | ICD-10-CM | POA: Diagnosis not present

## 2020-01-30 DIAGNOSIS — N946 Dysmenorrhea, unspecified: Secondary | ICD-10-CM | POA: Diagnosis not present

## 2020-01-30 DIAGNOSIS — Z1159 Encounter for screening for other viral diseases: Secondary | ICD-10-CM | POA: Diagnosis not present

## 2020-01-30 DIAGNOSIS — Z114 Encounter for screening for human immunodeficiency virus [HIV]: Secondary | ICD-10-CM | POA: Diagnosis not present

## 2020-01-30 DIAGNOSIS — Z118 Encounter for screening for other infectious and parasitic diseases: Secondary | ICD-10-CM | POA: Diagnosis not present

## 2020-01-30 DIAGNOSIS — Z01419 Encounter for gynecological examination (general) (routine) without abnormal findings: Secondary | ICD-10-CM | POA: Diagnosis not present

## 2020-01-30 DIAGNOSIS — Z3200 Encounter for pregnancy test, result unknown: Secondary | ICD-10-CM | POA: Diagnosis not present

## 2020-01-30 LAB — RESULTS CONSOLE HPV: CHL HPV: NEGATIVE

## 2020-01-30 LAB — HM PAP SMEAR

## 2020-02-05 ENCOUNTER — Emergency Department (INDEPENDENT_AMBULATORY_CARE_PROVIDER_SITE_OTHER)
Admission: EM | Admit: 2020-02-05 | Discharge: 2020-02-05 | Disposition: A | Discharge status: Home / Self Care | Payer: BC Managed Care – PPO | Source: Home / Self Care

## 2020-02-05 DIAGNOSIS — R0789 Other chest pain: Secondary | ICD-10-CM | POA: Diagnosis not present

## 2020-02-05 NOTE — ED Triage Notes (Signed)
Patient presents to Urgent Care with complaints of right sided chest pain, rib pain, and back since two days ago. Patient reports she got her covid booster the day before the pains started. 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 Pt in NAD at this time, ambulatory w/ steady gait, speaking in full sentences. Pt has not taken anything for pain today, reports pain is consistent and nothing seems to make it better or worse.

## 2020-02-05 NOTE — Discharge Instructions (Addendum)
May take some Aleve twice a day for discomfort

## 2020-02-05 NOTE — ED Provider Notes (Addendum)
[facility]    CSN: [id number] Arrival date & time: 02/05/20  1833      History   Chief Complaint Chief Complaint  Patient presents with  . Chest Pain    HPI Meghan Hayes is a 29 y.o. female.   Patient had third Covid vaccine last week and afterwards developed some discomfort in her chest back shoulders.  She denies any cough.  There is been no fever or chills.  HPI  Past Medical History:  Diagnosis Date  . Allergy    seasonal, nuts, wheat, corn, soy  . Gallbladder polyp 2011   6 mm has had repeat imaging and stable.   Marland Kitchen GERD (gastroesophageal reflux disease)   . Hashimoto's disease 2014  . Ileocecal valve syndrome 2017   followed by dr. Havery Moros  . Migraines   . Thyroid nodule 2014   last [place] 2016: 1.2cm thyroid nodule vs PTH adenoma.     Patient Active Problem List   Diagnosis Date Noted  . LAD (lymphadenopathy) of right cervical region 01/18/2018  . Pain of right mastoid 01/18/2018  . Right lumbar radiculitis 07/20/2017  . Patellofemoral syndrome, left 07/06/2017  . 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 Vitamin D deficiency 04/14/2016  . Throat pain 04/14/2016  . Thyroid nodule 04/14/2016  . GERD (gastroesophageal reflux disease)   . Hashimoto's thyroiditis 07/26/2012    Past Surgical History:  Procedure Laterality Date  . TONSILLECTOMY  2008  . WISDOM TOOTH EXTRACTION  23   has panic attack and crying after procedure    OB History    Gravida  0   Para      Term      Preterm      AB      Living        SAB      IAB      Ectopic      Multiple      Live Births               Home Medications    Prior to Admission medications   Medication Sig Start Date End Date Taking? Authorizing Provider  EPINEPHrine 0.3 mg/0.3 mL IJ SOAJ injection INJECT 0.3 MLS (0.3 MG TOTAL) INTO THE MUSCLE ONCE AS NEEDED FOR ANAPHYLAXIS. 01/31/19   Kuneff, Renee A, DO  MAGNESIUM MALATE PO Take 3.75 mg by mouth daily. 2 pills daily     [provider]  NP THYROID 30 MG tablet 1 tab daily x6 days a week, 1.5 tabs 1 day a week. 02/06/19   Kuneff, Renee A, DO  pantoprazole (PROTONIX) 40 MG tablet Take 1 tablet (40 mg total) by mouth daily. Office visit for further refills 05/29/19   Irene Shipper, MD    Family History Family History  Problem Relation Age of Onset  . Hypertension Father   . Allergies Father   . Hyperlipidemia Father   . Colon polyps Father   . Hypertension Maternal Grandmother   . Colon cancer Maternal Grandmother 72       colon  . Colon polyps Mother   . Hypertension Paternal Uncle   . Esophageal cancer Neg Hx   . Rectal cancer Neg Hx   . Stomach cancer Neg Hx     Social History Social History   Tobacco Use  . Smoking status: Never Smoker  . Smokeless tobacco: Never Used  Vaping Use  . Vaping Use: Never used  Substance Use Topics  . Alcohol use: No  .  Drug use: No     Allergies   Cefdinir, Corn oil, Gluten meal, Lactase, Other, Wheat bran, Eggs or egg-derived products, and Soy allergy   Review of Systems Review of Systems  Respiratory: Positive for chest tightness. Negative for cough and choking.   Cardiovascular: Positive for chest pain.  All other systems reviewed and are negative.    Physical Exam Triage Vital Signs ED Triage Vitals  Enc Vitals Group     BP 02/05/20 1847 (!) 129/94     Pulse Rate 02/05/20 1847 86     Resp 02/05/20 1847 16     Temp 02/05/20 1847 98.1 F (36.7 C)     Temp Source 02/05/20 1847 Oral     SpO2 02/05/20 1847 99 %     Weight --      Height --      Head Circumference --      Peak Flow --      Pain Score 02/05/20 1846 6     Pain Loc --      Pain Edu? --      Excl. in [place]? --    No data found.  Updated Vital Signs BP (!) 129/94 (BP Location: Left Arm)   Pulse 86   Temp 98.1 F (36.7 C) (Oral)   Resp 16   SpO2 99%   Visual Acuity Right Eye Distance:   Left Eye Distance:   Bilateral Distance:    Right Eye Near:   Left Eye  Near:    Bilateral Near:     Physical Exam Vitals and nursing note reviewed.  Constitutional:      Appearance: She is well-developed and normal weight.  Cardiovascular:     Rate and Rhythm: Normal rate and regular rhythm.     Heart sounds: Normal heart sounds.  Pulmonary:     Effort: Pulmonary effort is normal.     Breath sounds: Normal breath sounds.  Abdominal:     Palpations: Abdomen is soft.  Musculoskeletal:     Cervical back: Normal range of motion.  Neurological:     General: No focal deficit present.     Mental Status: She is alert and oriented to person, place, and time.      UC Treatments / Results  Labs (all labs ordered are listed, but only abnormal results are displayed) Labs Reviewed - No data to display  EKG   Radiology No results found.  Procedures Procedures (including critical care time)  Medications Ordered in UC Medications - No data to display  Initial Impression / Assessment and Plan / UC Course  I have reviewed the triage vital signs and the nursing notes.  Pertinent labs & imaging results that were available during my care of the patient were reviewed by me and considered in my medical decision making (see chart for details).     Chest wall pain.  May be related to vaccination but do not feel there is any cardiac or pulmonary disease. Final Clinical Impressions(s) / UC Diagnoses   Final diagnoses:  None   Discharge Instructions   None    ED Prescriptions    None     PDMP not reviewed this encounter.   Wardell Honour, MD 02/05/20 Meghan Hayes    Wardell Honour, MD 02/05/20 [phone]

## 2020-02-26 DIAGNOSIS — N926 Irregular menstruation, unspecified: Secondary | ICD-10-CM | POA: Diagnosis not present

## 2020-02-26 DIAGNOSIS — N9411 Superficial (introital) dyspareunia: Secondary | ICD-10-CM | POA: Diagnosis not present

## 2020-02-26 DIAGNOSIS — G43109 Migraine with aura, not intractable, without status migrainosus: Secondary | ICD-10-CM | POA: Diagnosis not present

## 2020-03-01 DIAGNOSIS — Z20822 Contact with and (suspected) exposure to covid-19: Secondary | ICD-10-CM | POA: Diagnosis not present

## 2020-03-08 DIAGNOSIS — Z20822 Contact with and (suspected) exposure to covid-19: Secondary | ICD-10-CM | POA: Diagnosis not present

## 2020-03-29 ENCOUNTER — Other Ambulatory Visit (INDEPENDENT_AMBULATORY_CARE_PROVIDER_SITE_OTHER): Payer: BC Managed Care – PPO

## 2020-03-29 ENCOUNTER — Other Ambulatory Visit: Payer: Self-pay

## 2020-03-29 ENCOUNTER — Encounter: Payer: Self-pay | Admitting: Nurse Practitioner

## 2020-03-29 ENCOUNTER — Ambulatory Visit: Payer: BC Managed Care – PPO | Admitting: Nurse Practitioner

## 2020-03-29 VITALS — BP 90/60 | HR 108 | Ht 68.0 in | Wt 125.4 lb

## 2020-03-29 DIAGNOSIS — R1031 Right lower quadrant pain: Secondary | ICD-10-CM

## 2020-03-29 DIAGNOSIS — R1012 Left upper quadrant pain: Secondary | ICD-10-CM

## 2020-03-29 DIAGNOSIS — R1013 Epigastric pain: Secondary | ICD-10-CM

## 2020-03-29 DIAGNOSIS — K5900 Constipation, unspecified: Secondary | ICD-10-CM

## 2020-03-29 DIAGNOSIS — G8929 Other chronic pain: Secondary | ICD-10-CM

## 2020-03-29 LAB — CBC WITH DIFFERENTIAL/PLATELET
Basophils Absolute: 0.1 10*3/uL (ref 0.0–0.1)
Basophils Relative: 1.2 % (ref 0.0–3.0)
Eosinophils Absolute: 0.1 10*3/uL (ref 0.0–0.7)
Eosinophils Relative: 1.3 % (ref 0.0–5.0)
HCT: 38.5 % (ref 36.0–46.0)
Hemoglobin: 12.8 g/dL (ref 12.0–15.0)
Lymphocytes Relative: 30 % (ref 12.0–46.0)
Lymphs Abs: 1.5 10*3/uL (ref 0.7–4.0)
MCHC: 33.3 g/dL (ref 30.0–36.0)
MCV: 85.6 fl (ref 78.0–100.0)
Monocytes Absolute: 0.5 10*3/uL (ref 0.1–1.0)
Monocytes Relative: 10.8 % (ref 3.0–12.0)
Neutro Abs: 2.8 10*3/uL (ref 1.4–7.7)
Neutrophils Relative %: 56.7 % (ref 43.0–77.0)
Platelets: 411 10*3/uL — ABNORMAL HIGH (ref 150.0–400.0)
RBC: 4.5 Mil/uL (ref 3.87–5.11)
RDW: 15 % (ref 11.5–15.5)
WBC: 4.9 10*3/uL (ref 4.0–10.5)

## 2020-03-29 LAB — COMPREHENSIVE METABOLIC PANEL
ALT: 13 U/L (ref 0–35)
AST: 14 U/L (ref 0–37)
Albumin: 4.7 g/dL (ref 3.5–5.2)
Alkaline Phosphatase: 49 U/L (ref 39–117)
BUN: 13 mg/dL (ref 6–23)
CO2: 29 mEq/L (ref 19–32)
Calcium: 10.1 mg/dL (ref 8.4–10.5)
Chloride: 104 mEq/L (ref 96–112)
Creatinine, Ser: 0.65 mg/dL (ref 0.40–1.20)
GFR: 120.26 mL/min (ref >60.00)
Glucose, Bld: 57 mg/dL — ABNORMAL LOW (ref 70–99)
Potassium: 4.2 mEq/L (ref 3.5–5.1)
Sodium: 139 mEq/L (ref 135–145)
Total Bilirubin: 0.7 mg/dL (ref 0.2–1.2)
Total Protein: 7.5 g/dL (ref 6.0–8.3)

## 2020-03-29 LAB — C-REACTIVE PROTEIN: CRP: <1 mg/dL (ref 0.5–20.0)

## 2020-03-29 LAB — LIPASE: Lipase: 8 U/L — ABNORMAL LOW (ref 11.0–59.0)

## 2020-03-29 MED ORDER — PANTOPRAZOLE SODIUM 40 MG PO TBEC: 40.0000 mg | DELAYED_RELEASE_TABLET | Freq: Two times a day (BID) | ORAL | 1 refills | Status: DC

## 2020-03-29 NOTE — Patient Instructions (Addendum)
If you are age 28 or older, your body mass index should be between 23-30. Your Body mass index is 19.06 kg/m. If this is out of the aforementioned range listed, please consider follow up with your Primary Care Provider.  If you are age 82 or younger, your body mass index should be between 19-25. Your Body mass index is 19.06 kg/m. If this is out of the aformentioned range listed, please consider follow up with your Primary Care Provider.   LABS:   Your provider has requested that you go to the basement level for lab work before leaving today. Press "B" on the elevator. The lab is located at the first door on the left as you exit the elevator.  HEALTHCARE LAWS AND MY CHART RESULTS: Due to recent changes in healthcare laws, you may see the results of your imaging and laboratory studies on MyChart before your provider has had a chance to review them.   We understand that in some cases there may be results that are confusing or concerning to you. Not all laboratory results come back in the same time frame and the provider may be waiting for multiple results in order to interpret others.  Please give [place] 48 hours in order for your provider to thoroughly review all the results before contacting the office for clarification of your results.   ULTRASOUND  You have been scheduled for an abdominal ultrasound at [facility] (1st floor of hospital) on 04/05/20 at 9:15 AM.   Please arrive 15 minutes prior to your appointment for registration.  Make certain not to have anything to eat or drink after midnight prior to your appointment.  Should you need to reschedule your appointment, please contact radiology at [phone]. This test typically takes about 30 minutes to perform.  Do not take any Ibuprofen.  Please take Tylenol 500 MG twice a day as needed.  We have scheduled a follow up with Dr. Henrene Pastor on 05/14/20 at 11:20 AM. Please call if you need to change the appointment.  OVER THE COUNTER  MEDICATION  Please purchase the following medications over the counter and take as directed:  Miralax. Dissolve one capful in 8 ounces of water and drink before bed.  IBgard. Take one a day, we have given you samples to try.  MEDICATION  We have sent the following medication to your pharmacy for you to pick up at your convenience:  Pantoprazole 40 MG twice a day.  It was great seeing you today!  Thank you for entrusting me with your care and choosing [facility].  Noralyn Pick, CRNP

## 2020-03-29 NOTE — Progress Notes (Addendum)
03/29/2020 Meghan Hayes [id number] 1990/09/23   Chief Complaint: LUQ abdominal pain   History of Present Illness: Meghan Hayes is a 30 year old female with a past medical history of a Hashimoto's thyroiditis, thyroid nodule, chronic right mid to RLQ pain and GERD. Past tonsillectomy and wisdom tooth extraction. She was last see in office by Dr. Henrene Pastor 03/08/2018 for further evaluation for reflux, globus sensation, chronic right sided abdominal pain, constipation and abdominal bloat. She underwent an EGD and colonoscopy 12/02/2018 which were normal. No evidence of celiac disease or IBD.  She presents to our office today for further evaluation regarding left upper quadrant abdominal pain.  She developed mild nausea on Monday, 03/25/2020.  She developed epigastric pain which radiated to her left upper quadrant on Tuesday 03/26/2020 which occurred at nighttime.  Her left upper quadrant pain is constant.  Her nausea has lessened.  No vomiting.  She is taking Pantoprazole 40 mg once daily for the past few years.  She has intermittent constipation.  She is passing a normal formed brown bowel movement every other day.  She felt a bit more constipated for 1 day last week.  She takes magnesium most days.  No rectal bleeding or black stools.  She takes Ibuprofen 600 mg every 4 hours for 2 days monthly having abdominal cramping during her menstrual cycle.  She continues to have the same chronic daily right mid to right lower quadrant abdominal pain.  She is eating a bland diet for the past few days consisting mostly of rice and rice chex cereal. She went to the urgent care clinic on 02/23/2020 with chest pain. She received a Covid vaccine booster one week prior and shortly after developed discomfort in her chest, back and shoulders.  Laboratory studies or chest/abdominal imaging was done.  Her exam was stable so she was discharged home.  No further chest or upper back pain since then.  She has a history of a 40mm gallbladder  polyp abdominal sonogram in 2011 and 2016. Maternal grandmother with history of colon cancer. Father and mother with history of colon polyps,   CBC Latest Ref Rng & Units 03/08/2018 01/11/2015  WBC 4.0 - 10.5 K/uL 4.5 7.8  Hemoglobin 12.0 - 15.0 g/dL 13.9 14.0  Hematocrit 36.0 - 46.0 % 41.4 42.3  Platelets 150.0 - 400.0 K/uL 340.0 415.0(H)   CMP Latest Ref Rng & Units 03/08/2018 01/11/2015  Glucose 70 - 99 mg/dL 76 83  BUN 6 - 23 mg/dL 7 8  Creatinine 0.40 - 1.20 mg/dL 0.61 0.76  Sodium 135 - 145 mEq/L 139 139  Potassium 3.5 - 5.1 mEq/L 4.4 4.3  Chloride 96 - 112 mEq/L 106 104  CO2 19 - 32 mEq/L 25 28  Calcium 8.4 - 10.5 mg/dL 9.6 9.8  Total Protein 6.0 - 8.3 g/dL 7.2 7.7  Total Bilirubin 0.2 - 1.2 mg/dL 0.6 0.9  Alkaline Phos 39 - 117 U/L 48 61  AST 0 - 37 U/L 17 14  ALT 0 - 35 U/L 17 8  TSH 1.25.   EGD 12/02/2018: - The esophagus was normal. - The stomach was normal. - The examined duodenum was normal. - The cardia and gastric fundus were normal on retroflexion.  Colonoscopy 12/02/2018: - The examined portion of the ileum was normal. - The entire examined colon is normal on direct and retroflexion views. - No specimens collected.  Abdominal sonogram 01/18/2015: 6 mm non mobile, non shadowing echogenic focus noted. This could represent a polyp  or tumefactive adherent sludge. No biliary distention . Exam otherwise negative  Current Outpatient Medications on File Prior to Visit  Medication Sig Dispense Refill  . MAGNESIUM MALATE PO Take 3.75 mg by mouth daily. 2 pills daily    . NP THYROID 30 MG tablet 1 tab daily x6 days a week, 1.5 tabs 1 day a week. (Patient taking differently: 1 tab daily) 92 tablet 3  . EPINEPHrine 0.3 mg/0.3 mL IJ SOAJ injection INJECT 0.3 MLS (0.3 MG TOTAL) INTO THE MUSCLE ONCE AS NEEDED FOR ANAPHYLAXIS. (Patient not taking: Reported on 03/29/2020) 1 each 1   No current facility-administered medications on file prior to visit.    Allergies   Allergen Reactions  . Cefdinir Anaphylaxis, Rash and Swelling    throat  . Corn Oil Anaphylaxis  . Gluten Meal Anaphylaxis  . Lactase Swelling    All dairy products make intestines swell All dairy products make intestines swell All dairy products make intestines swell  . Other Anaphylaxis and Swelling    All dairy products make intestines swell abd swelling All nuts  . Wheat Bran Anaphylaxis  . Eggs Or Egg-Derived Products     abd swelling  . Soy Allergy     Current Medications, Allergies, Past Medical History, Past Surgical History, Family History and Social History were reviewed in [employer] record.   Review of Systems:   Constitutional: Negative for fever, sweats, chills or weight loss.  Respiratory: Negative for shortness of breath.   Cardiovascular: See HPI. Gastrointestinal: See HPI.  Musculoskeletal: Negative for back pain or muscle aches.  Neurological: Negative for dizziness, headaches or paresthesias.   Physical Exam: BP 90/60 (BP Location: Left Arm, Patient Position: Sitting, Cuff Size: Normal)   Pulse (!) 108   Ht 5\' 8"  (1.727 m) Comment: height meaured without shoes  Wt 125 lb 6 oz (56.9 kg)   LMP 03/16/2020   BMI 19.06 kg/m   General: 30 year old female in no acute distress. Head: Normocephalic and atraumatic. Eyes: No scleral icterus. Conjunctiva pink . Ears: Normal auditory acuity. Mouth: Dentition intact. No ulcers or lesions.  Lungs: Clear throughout to auscultation. Heart: Regular rate and rhythm, no murmur. Abdomen: Soft, nondistended. Mild epigastric, RUQ and LUQ tenderness without rebound or guarding. No right mid/RLQ tenderness. No masses or hepatomegaly. Normal bowel sounds x 4 quadrants. Soft epigastric bruit (patient ate cereal prior to her appointment).  Rectal: Deferred.  Musculoskeletal: Symmetrical with no gross deformities. Extremities: No edema. Neurological: Alert oriented x 4. No focal deficits.   Psychological: Alert and cooperative. Normal mood and affect  Assessment and Recommendations: 65.  30 year old female with a history of GERD.  Epigastric and left upper quadrant abdominal pain x 3 days. Normal EGD 11/2018. -Increase Pantoprazole 40 mg p.o. twice daily for 2 weeks then reduce to once daily -RUQ sonogram, to further evaluate prior gallbladder polyps, rule out gallstones  -I discussed trial with hyoscyamine or Bentyl, however there is some concern regarding corn oil allergy with these medications.  She will try IBgard 1 p.o. twice daily for now. -CBC, CMP, CRP and lipase  2.  Constipation -MiraLAX nightly  3.  Chronic right mid to right lower quadrant abdominal pain, stable at this time. Normal colonoscopy 11/2018, no evidence of IBD. -See plan in #1 and #2 -Consider CTAP versus CT enterography if right mid to lower abdominal pain worsens  4.  History of Hashimoto's thyroiditis

## 2020-03-29 NOTE — Progress Notes (Signed)
Noted  

## 2020-04-05 ENCOUNTER — Other Ambulatory Visit: Payer: Self-pay

## 2020-04-05 ENCOUNTER — Ambulatory Visit (HOSPITAL_COMMUNITY)
Admission: RE | Admit: 2020-04-05 | Discharge: 2020-04-05 | Disposition: A | Discharge status: Home / Self Care | Payer: BC Managed Care – PPO | Source: Ambulatory Visit | Attending: Nurse Practitioner | Admitting: Nurse Practitioner

## 2020-04-05 DIAGNOSIS — R1012 Left upper quadrant pain: Secondary | ICD-10-CM | POA: Insufficient documentation

## 2020-04-05 DIAGNOSIS — R1031 Right lower quadrant pain: Secondary | ICD-10-CM | POA: Diagnosis not present

## 2020-04-05 DIAGNOSIS — G8929 Other chronic pain: Secondary | ICD-10-CM | POA: Insufficient documentation

## 2020-04-05 DIAGNOSIS — K5900 Constipation, unspecified: Secondary | ICD-10-CM | POA: Diagnosis not present

## 2020-04-05 DIAGNOSIS — R1013 Epigastric pain: Secondary | ICD-10-CM | POA: Diagnosis not present

## 2020-04-05 DIAGNOSIS — K824 Cholesterolosis of gallbladder: Secondary | ICD-10-CM | POA: Diagnosis not present

## 2020-04-18 ENCOUNTER — Telehealth: Payer: Self-pay | Admitting: Nurse Practitioner

## 2020-04-18 ENCOUNTER — Other Ambulatory Visit (INDEPENDENT_AMBULATORY_CARE_PROVIDER_SITE_OTHER): Payer: BC Managed Care – PPO

## 2020-04-18 ENCOUNTER — Other Ambulatory Visit: Payer: Self-pay | Admitting: Nurse Practitioner

## 2020-04-18 DIAGNOSIS — R110 Nausea: Secondary | ICD-10-CM | POA: Diagnosis not present

## 2020-04-18 DIAGNOSIS — R1031 Right lower quadrant pain: Secondary | ICD-10-CM

## 2020-04-18 DIAGNOSIS — G8929 Other chronic pain: Secondary | ICD-10-CM

## 2020-04-18 LAB — CBC
HCT: 35.7 % — ABNORMAL LOW (ref 36.0–46.0)
Hemoglobin: 12 g/dL (ref 12.0–15.0)
MCHC: 33.5 g/dL (ref 30.0–36.0)
MCV: 86 fl (ref 78.0–100.0)
Platelets: 330 10*3/uL (ref 150.0–400.0)
RBC: 4.15 Mil/uL (ref 3.87–5.11)
RDW: 14.2 % (ref 11.5–15.5)
WBC: 8.5 10*3/uL (ref 4.0–10.5)

## 2020-04-18 LAB — BASIC METABOLIC PANEL
BUN: 12 mg/dL (ref 6–23)
CO2: 30 mEq/L (ref 19–32)
Calcium: 9.5 mg/dL (ref 8.4–10.5)
Chloride: 104 mEq/L (ref 96–112)
Creatinine, Ser: 0.74 mg/dL (ref 0.40–1.20)
GFR: 110.47 mL/min (ref >60.00)
Glucose, Bld: 72 mg/dL (ref 70–99)
Potassium: 3.8 mEq/L (ref 3.5–5.1)
Sodium: 139 mEq/L (ref 135–145)

## 2020-04-18 LAB — HEPATIC FUNCTION PANEL
ALT: 10 U/L (ref 0–35)
AST: 12 U/L (ref 0–37)
Albumin: 4.4 g/dL (ref 3.5–5.2)
Alkaline Phosphatase: 42 U/L (ref 39–117)
Bilirubin, Direct: 0.1 mg/dL (ref 0.0–0.3)
Total Bilirubin: 0.4 mg/dL (ref 0.2–1.2)
Total Protein: 7.1 g/dL (ref 6.0–8.3)

## 2020-04-18 MED ORDER — ONDANSETRON 4 MG PO TBDP: 4.0000 mg | ORAL_TABLET | Freq: Four times a day (QID) | ORAL | 0 refills | Status: DC | PRN

## 2020-04-18 NOTE — Telephone Encounter (Signed)
Spoke to patient to let her know that medication has been sent to her pharmacy. She will come by the office for lab work in the next day or two, Patient knows to go to the ED if symptoms worsen .All questions answered. Patient voiced understanding

## 2020-04-18 NOTE — Telephone Encounter (Signed)
Spoke to patient who reports continued and worsening  right abdominal pain. She is waiting on an appointment with [facility] to discuss consideration for a cholecystectomy. Contacted [facility] to check on the status of her referral. They will contact patient today or tomorrow to schedule her appointment.She reports feeling dizzy and light headed lately and feels it is from her constant pain and nausea. She was advised to call her PCP regarding those symptoms as she has a history of low blood sugar readings.She will keep hydrated with fruit juices.   Patient is requesting medication to help manage her nausea as it has become difficult to maintain healthy PO intake. Colleen please advise

## 2020-04-18 NOTE — Telephone Encounter (Signed)
Kelly, Pls call the patient, send her to the lab for CBC, hepatic panel and BMP. Send in Rx for Zofran 4mg   ODT one tab dissolve on tongue Q 6 to 8 Hrs PRN # 20, no refills. If patient's pain or nausea is worse, unable to tolerate po liquids then she needs to go to the ED. Thx

## 2020-04-18 NOTE — Telephone Encounter (Signed)
Patient called to advise she is feeling worst and is seeking advise

## 2020-04-19 DIAGNOSIS — K824 Cholesterolosis of gallbladder: Secondary | ICD-10-CM | POA: Diagnosis not present

## 2020-04-19 DIAGNOSIS — R109 Unspecified abdominal pain: Secondary | ICD-10-CM | POA: Diagnosis not present

## 2020-04-24 DIAGNOSIS — E162 Hypoglycemia, unspecified: Secondary | ICD-10-CM | POA: Diagnosis not present

## 2020-04-24 DIAGNOSIS — R109 Unspecified abdominal pain: Secondary | ICD-10-CM | POA: Diagnosis not present

## 2020-04-24 DIAGNOSIS — K8020 Calculus of gallbladder without cholecystitis without obstruction: Secondary | ICD-10-CM | POA: Diagnosis not present

## 2020-04-24 DIAGNOSIS — R1012 Left upper quadrant pain: Secondary | ICD-10-CM | POA: Diagnosis not present

## 2020-04-29 DIAGNOSIS — K824 Cholesterolosis of gallbladder: Secondary | ICD-10-CM | POA: Diagnosis not present

## 2020-05-14 ENCOUNTER — Ambulatory Visit: Payer: BC Managed Care – PPO | Admitting: Internal Medicine

## 2020-05-14 DIAGNOSIS — K811 Chronic cholecystitis: Secondary | ICD-10-CM | POA: Diagnosis not present

## 2020-05-14 DIAGNOSIS — K8020 Calculus of gallbladder without cholecystitis without obstruction: Secondary | ICD-10-CM | POA: Diagnosis not present

## 2020-05-14 DIAGNOSIS — E063 Autoimmune thyroiditis: Secondary | ICD-10-CM | POA: Diagnosis not present

## 2020-05-14 DIAGNOSIS — K2960 Other gastritis without bleeding: Secondary | ICD-10-CM | POA: Diagnosis not present

## 2020-05-14 DIAGNOSIS — Z79890 Hormone replacement therapy (postmenopausal): Secondary | ICD-10-CM | POA: Diagnosis not present

## 2020-05-14 DIAGNOSIS — K219 Gastro-esophageal reflux disease without esophagitis: Secondary | ICD-10-CM | POA: Diagnosis not present

## 2020-05-14 DIAGNOSIS — Z79899 Other long term (current) drug therapy: Secondary | ICD-10-CM | POA: Diagnosis not present

## 2020-05-31 DIAGNOSIS — Z9049 Acquired absence of other specified parts of digestive tract: Secondary | ICD-10-CM | POA: Diagnosis not present

## 2020-05-31 DIAGNOSIS — K824 Cholesterolosis of gallbladder: Secondary | ICD-10-CM | POA: Diagnosis not present

## 2020-06-19 ENCOUNTER — Other Ambulatory Visit: Payer: Self-pay | Admitting: Nurse Practitioner

## 2020-06-24 DIAGNOSIS — Z9049 Acquired absence of other specified parts of digestive tract: Secondary | ICD-10-CM | POA: Diagnosis not present

## 2020-06-24 DIAGNOSIS — K219 Gastro-esophageal reflux disease without esophagitis: Secondary | ICD-10-CM | POA: Diagnosis not present

## 2020-06-24 DIAGNOSIS — Z48815 Encounter for surgical aftercare following surgery on the digestive system: Secondary | ICD-10-CM | POA: Diagnosis not present

## 2020-06-24 DIAGNOSIS — E063 Autoimmune thyroiditis: Secondary | ICD-10-CM | POA: Diagnosis not present

## 2020-06-24 DIAGNOSIS — R112 Nausea with vomiting, unspecified: Secondary | ICD-10-CM | POA: Diagnosis not present

## 2020-06-24 DIAGNOSIS — K5900 Constipation, unspecified: Secondary | ICD-10-CM | POA: Diagnosis not present

## 2020-06-29 DIAGNOSIS — Z9049 Acquired absence of other specified parts of digestive tract: Secondary | ICD-10-CM | POA: Diagnosis not present

## 2020-06-29 DIAGNOSIS — R109 Unspecified abdominal pain: Secondary | ICD-10-CM | POA: Diagnosis not present

## 2020-07-04 ENCOUNTER — Encounter: Payer: Self-pay | Admitting: Internal Medicine

## 2020-07-04 ENCOUNTER — Ambulatory Visit: Payer: BC Managed Care – PPO | Admitting: Internal Medicine

## 2020-07-04 VITALS — BP 92/64 | HR 88 | Ht 68.0 in | Wt 126.0 lb

## 2020-07-04 DIAGNOSIS — R1013 Epigastric pain: Secondary | ICD-10-CM | POA: Diagnosis not present

## 2020-07-04 NOTE — Patient Instructions (Signed)
Try to gradually increase your exercise tolerance.  Please follow up as needed

## 2020-07-04 NOTE — Progress Notes (Signed)
HISTORY OF PRESENT ILLNESS:  Meghan Hayes is a 30 y.o. female, 2017 [employer] graduate and daughter of Meghan Hayes and Meghan Hayes, who I saw in January 2020 for chronic abdominal complaints.  Previously seen by Dr. Havery Hayes in 2016 regarding the same.  She does have GERD.  She was prescribed pantoprazole.  Upper endoscopy and colonoscopy were performed December 02, 2018.  Colonoscopy including intubation of the terminal ileum was normal.  Upper endoscopy was normal.  Ultrasound revealed gallbladder polyp.  Meckel scan was recommended but not performed.  She was subsequently seen February 2022 by the GI nurse practitioner.  Complaints were upper abdominal pain.  PPI was increased.  Abdominal ultrasound was performed.  Found to have a gallbladder polyp that had increased in size.  She went to [facility] and had laparoscopic cholecystectomy May 14, 2020.  Since that time she reports a new pain in the epigastric region.  She states that she has this daily.  There is some radiation into the back.  She describes it as a twisting knife which lasts up to 20 minutes.  Notices this in the middle of the night between 3 AM and 8 AM.  This does not awaken her from sleep however.  This is not affected by food or change in position.  She went back to [facility] and had MRI MRCP performed 4 days ago.  Examination was unremarkable postcholecystectomy.  No cause for symptoms found.  This appointment made.  Blood work at [facility] revealed normal liver tests.  Normal CBC.  Normal amylase and lipase.  REVIEW OF SYSTEMS:  All non-GI ROS negative entirely  Past Medical History:  Diagnosis Date  . Allergy    seasonal, nuts, wheat, corn, soy  . Gallbladder polyp 2011   6 mm has had repeat imaging and stable.   Marland Kitchen GERD (gastroesophageal reflux disease)   . Hashimoto's disease 2014  . Ileocecal valve syndrome 2017   followed by dr. Havery Hayes  . Migraines   . Thyroid nodule 2014   last [place] 2016: 1.2cm thyroid nodule vs PTH  adenoma.     Past Surgical History:  Procedure Laterality Date  . CHOLECYSTECTOMY    . TONSILLECTOMY  2008  . WISDOM TOOTH EXTRACTION  23   has panic attack and crying after procedure    Social History Meghan Hayes  reports that she has never smoked. She has never used smokeless tobacco. She reports that she does not drink alcohol and does not use drugs.  family history includes Allergies in her father; Colon cancer (age of onset: 32) in her maternal grandmother; Colon polyps in her father and mother; Hyperlipidemia in her father; Hypertension in her father, maternal grandmother, and paternal uncle.  Allergies  Allergen Reactions  . Cefdinir Anaphylaxis, Rash and Swelling    throat  . Corn Oil Anaphylaxis  . Gluten Meal Anaphylaxis  . Lactase Swelling    All dairy products make intestines swell All dairy products make intestines swell All dairy products make intestines swell  . Other Anaphylaxis and Swelling    All dairy products make intestines swell abd swelling All nuts  . Tinidazole Anaphylaxis and Rash  . Wheat Bran Anaphylaxis  . Eggs Or Egg-Derived Products     abd swelling  . Soy Allergy        PHYSICAL EXAMINATION: Vital signs: BP 92/64   Pulse 88   Ht 5\' 8"  (1.727 m)   Wt 126 lb (57.2 kg)   BMI 19.16 kg/m  Constitutional: generally well-appearing, no acute distress Psychiatric: alert and oriented x3, cooperative Eyes: extraocular movements intact, anicteric, conjunctiva pink Mouth: Mask Neck: supple no lymphadenopathy Cardiovascular: heart regular rate and rhythm, no murmur Lungs: clear to auscultation bilaterally Abdomen: soft, tenderness with mild palpation over the superficial abdomen, nondistended, no obvious ascites, no peritoneal signs, normal bowel sounds, no organomegaly Rectal: Omitted Extremities: no clubbing, cyanosis, or lower extremity edema bilaterally Skin: no lesions on visible extremities Neuro: No focal deficits. No asterixis.      ASSESSMENT:  1.  Superficial abdominal wall pain.  Possibly related to puncture site from recent cholecystectomy.  This is not an intra-abdominal process 2.  Chronic abdominal complaints with extensive negative evaluations including EGD, colonoscopy, laboratories, and imaging 3.  Status post cholecystectomy for gallbladder polyp May 14, 2020 at [facility] 4.  GERD  PLAN:  1.  Reassurance 2.  NSAIDs as needed for significant discomfort 3.  Antispasmodics on demand.  Wanted to prescribe Bentyl or sublingual Levsin but the contraindication was corn oil allergy 4.  Increase exercise 5.  Follow-up as needed

## 2020-07-05 ENCOUNTER — Other Ambulatory Visit (HOSPITAL_COMMUNITY): Payer: Self-pay

## 2020-07-05 ENCOUNTER — Telehealth: Payer: Self-pay

## 2020-07-05 ENCOUNTER — Encounter: Payer: Self-pay | Admitting: Internal Medicine

## 2020-07-05 MED ORDER — HYOSCYAMINE SULFATE ER 0.375 MG PO TB12: 0.3750 mg | ORAL_TABLET | Freq: Two times a day (BID) | ORAL | 0 refills | Status: DC | PRN

## 2020-07-05 NOTE — Telephone Encounter (Signed)
Meghan Hayes, thank you for the good work.  Prescribe Levbid 0.375 mg; #60; no refills take 1 by mouth as needed for severe pain.  No more than 1 every 12 hours.  Thanks

## 2020-07-05 NOTE — Telephone Encounter (Signed)
Sent in Emer[facility] as directed by Dr. Henrene Pastor

## 2020-07-05 NOTE — Telephone Encounter (Signed)
When I went to send in Bentyl for the patient, a warning came up because of a corn oil allergy, which causes anaphylaxis. I clarified with the patient that she does in fact have this allergy to corn starch.  Levsin had the same warning.  Levbid did not have a warning.  I spoke with the pharmacist who said that all three of these medications have corn starch in them, a common filler in medications, including Tylenol and Advil.  He said that if she could tolerate corn starch, then Levbid should be ok., especially since it was not accompanied by a warning.  Spoke to patient again who stated that although she tries to avoid it, she can, in fact, tolerate things with corn starch.  Please advise if it is ok to send Levbid and the dose.

## 2020-07-16 ENCOUNTER — Other Ambulatory Visit: Payer: Self-pay | Admitting: Nurse Practitioner

## 2020-08-07 ENCOUNTER — Other Ambulatory Visit: Payer: Self-pay | Admitting: Internal Medicine

## 2020-08-14 ENCOUNTER — Other Ambulatory Visit: Payer: Self-pay | Admitting: Nurse Practitioner

## 2020-08-20 ENCOUNTER — Other Ambulatory Visit: Payer: Self-pay | Admitting: Internal Medicine

## 2020-09-09 ENCOUNTER — Other Ambulatory Visit: Payer: Self-pay

## 2020-09-09 ENCOUNTER — Emergency Department (INDEPENDENT_AMBULATORY_CARE_PROVIDER_SITE_OTHER): Payer: BC Managed Care – PPO

## 2020-09-09 ENCOUNTER — Encounter: Payer: Self-pay | Admitting: Emergency Medicine

## 2020-09-09 ENCOUNTER — Emergency Department
Admission: EM | Admit: 2020-09-09 | Discharge: 2020-09-09 | Disposition: A | Discharge status: Home / Self Care | Payer: BC Managed Care – PPO | Source: Home / Self Care | Attending: Family Medicine | Admitting: Family Medicine

## 2020-09-09 DIAGNOSIS — S92534A Nondisplaced fracture of distal phalanx of right lesser toe(s), initial encounter for closed fracture: Secondary | ICD-10-CM | POA: Diagnosis not present

## 2020-09-09 DIAGNOSIS — S99921A Unspecified injury of right foot, initial encounter: Secondary | ICD-10-CM | POA: Diagnosis not present

## 2020-09-09 DIAGNOSIS — S92531A Displaced fracture of distal phalanx of right lesser toe(s), initial encounter for closed fracture: Secondary | ICD-10-CM | POA: Diagnosis not present

## 2020-09-09 DIAGNOSIS — S92341A Displaced fracture of fourth metatarsal bone, right foot, initial encounter for closed fracture: Secondary | ICD-10-CM | POA: Diagnosis not present

## 2020-09-09 NOTE — Discharge Instructions (Signed)
May take Tylenol or ibuprofen or Aleve for pain Use ice and elevation to reduce pain and swelling Wear fracture shoe for first 2 weeks.  After this may wear a firm bottom shoe.  You will need to be in a modified shoe with buddy taping for likely 4 to 6 weeks.  You may discontinue the shoe and discontinue the buddy taping when the pain improves Follow-up with your primary care doctor as needed.  Consider orthopedic or podiatry if toe pain persists

## 2020-09-09 NOTE — ED Provider Notes (Signed)
[facility]    CSN: [id number] Arrival date & time: 09/09/20  1850      History   Chief Complaint Chief Complaint  Patient presents with   Toe Injury    HPI Meghan Hayes is a 30 y.o. female.   HPI patient accidentally stubbed her toes on her right foot 3 days ago.  Swollen and painful.  She is here because the toe,, fourth , has turned purple.  Past Medical History:  Diagnosis Date   Allergy    seasonal, nuts, wheat, corn, soy   Gallbladder polyp 2011   6 mm has had repeat imaging and stable.    GERD (gastroesophageal reflux disease)    Hashimoto's disease 2014   Ileocecal valve syndrome 2017   followed by dr. Havery Moros   Migraines    Thyroid nodule 2014   last [place] 2016: 1.2cm thyroid nodule vs PTH adenoma.     Patient Active Problem List   Diagnosis Date Noted   Low ferritin level 10/09/2019   Hyperhomocysteinemia ([place]) 10/09/2019   Iodine deficiency 10/09/2019   LAD (lymphadenopathy) of right cervical region 01/18/2018   Pain of right mastoid 01/18/2018   Right lumbar radiculitis 07/20/2017   Patellofemoral syndrome, left 07/06/2017   Tibialis posterior tendinitis 08/10/2016   Vitamin D deficiency 04/14/2016   Throat pain 04/14/2016   Thyroid nodule 04/14/2016   GERD (gastroesophageal reflux disease)    Periumbilical abdominal pain 08/20/2014   Hashimoto's thyroiditis 07/26/2012   Dysphagia 03/24/2007   Gastro-esophageal reflux disease with esophagitis 02/25/2007    Past Surgical History:  Procedure Laterality Date   CHOLECYSTECTOMY     TONSILLECTOMY  2008   WISDOM TOOTH EXTRACTION  23   has panic attack and crying after procedure    OB History     Gravida  0   Para      Term      Preterm      AB      Living         SAB      IAB      Ectopic      Multiple      Live Births               Home Medications    Prior to Admission medications   Medication Sig Start Date End Date Taking? Authorizing Provider   diphenhydrAMINE (BENADRYL) 25 mg capsule Take 25 mg by mouth as needed for allergies.   Yes [provider]  EPINEPHrine 0.3 mg/0.3 mL IJ SOAJ injection INJECT 0.3 MLS (0.3 MG TOTAL) INTO THE MUSCLE ONCE AS NEEDED FOR ANAPHYLAXIS. 01/31/19   Kuneff, Renee A, DO  NP THYROID 30 MG tablet 1 tab daily x6 days a week, 1.5 tabs 1 day a week. Patient taking differently: 1 tab daily 02/06/19   Kuneff, Renee A, DO  pantoprazole (PROTONIX) 40 MG tablet TAKE 1 TABLET (40 MG TOTAL) BY MOUTH 2 (TWO) TIMES DAILY. OFFICE VISIT FOR FURTHER REFILLS 08/14/20   Noralyn Pick, NP    Family History Family History  Problem Relation Age of Onset   Hypertension Father    Allergies Father    Hyperlipidemia Father    Colon polyps Father    Hypertension Maternal Grandmother    Colon cancer Maternal Grandmother 53       colon   Colon polyps Mother    Hypertension Paternal Uncle    Esophageal cancer Neg Hx    Rectal cancer Neg Hx  Stomach cancer Neg Hx     Social History Social History   Tobacco Use   Smoking status: Never   Smokeless tobacco: Never  Vaping Use   Vaping Use: Never used  Substance Use Topics   Alcohol use: No   Drug use: No     Allergies   Cefdinir, Corn oil, Gluten meal, Lactase, Other, Tinidazole, Wheat bran, Eggs or egg-derived products, and Soy allergy   Review of Systems Review of Systems See HPI  Physical Exam Triage Vital Signs ED Triage Vitals  Enc Vitals Group     BP 09/09/20 1939 111/79     Pulse Rate 09/09/20 1939 88     Resp 09/09/20 1939 16     Temp 09/09/20 1939 98.4 F (36.9 C)     Temp Source 09/09/20 1939 Oral     SpO2 09/09/20 1939 97 %     Weight 09/09/20 1941 120 lb (54.4 kg)     Height 09/09/20 1941 5\' 8"  (1.727 m)     Head Circumference --      Peak Flow --      Pain Score 09/09/20 1940 7     Pain Loc --      Pain Edu? --      Excl. in [place]? --    No data found.  Updated Vital Signs BP 111/79 (BP Location: Left Arm)    Pulse 88   Temp 98.4 F (36.9 C) (Oral)   Resp 16   Ht 5\' 8"  (1.727 m)   Wt 54.4 kg   LMP 08/26/2020 (Exact Date)   SpO2 97%   BMI 18.25 kg/m      Physical Exam Constitutional:      General: She is not in acute distress.    Appearance: She is well-developed.  HENT:     Head: Normocephalic and atraumatic.  Eyes:     Conjunctiva/sclera: Conjunctivae normal.     Pupils: Pupils are equal, round, and reactive to light.  Cardiovascular:     Rate and Rhythm: Normal rate.  Pulmonary:     Effort: Pulmonary effort is normal. No respiratory distress.  Abdominal:     General: There is no distension.     Palpations: Abdomen is soft.  Musculoskeletal:        General: Normal range of motion.     Cervical back: Normal range of motion.     Comments: Fourth toe right foot has diffuse swelling.  Around the DIP area there is some purple discoloration.  Tenderness at the DIP.  Skin:    General: Skin is warm and dry.  Neurological:     Mental Status: She is alert.     Gait: Gait abnormal.     UC Treatments / Results  Labs (all labs ordered are listed, but only abnormal results are displayed) Labs Reviewed - No data to display  EKG   Radiology DG Toe 4th Right  Result Date: 09/09/2020 CLINICAL DATA:  Right fourth toe injury, pain EXAM: RIGHT FOURTH TOE COMPARISON:  None. FINDINGS: Three view radiograph right fourth digit demonstrates a a minimally displaced anatomically aligned intra-articular fracture of the medial base of the distal phalanx of the fourth digit. The articular surface is congruent. No other fracture or dislocation. Joint spaces are preserved. IMPRESSION: Minimally displaced anatomically aligned intra-articular fracture of the base of the distal phalanx of the right fourth digit. Electronically Signed   By: Fidela Salisbury MD   On: 09/09/2020 20:10    Procedures Procedures (  including critical care time)  Medications Ordered in UC Medications - No data to  display  Initial Impression / Assessment and Plan / UC Course  I have reviewed the triage vital signs and the nursing notes.  Pertinent labs & imaging results that were available during my care of the patient were reviewed by me and considered in my medical decision making (see chart for details).     Reviewed conservative treatment of toe fracture. Final Clinical Impressions(s) / UC Diagnoses   Final diagnoses:  Closed nondisplaced fracture of distal phalanx of lesser toe of right foot, initial encounter     Discharge Instructions      May take Tylenol or ibuprofen or Aleve for pain Use ice and elevation to reduce pain and swelling Wear fracture shoe for first 2 weeks.  After this may wear a firm bottom shoe.  You will need to be in a modified shoe with buddy taping for likely 4 to 6 weeks.  You may discontinue the shoe and discontinue the buddy taping when the pain improves Follow-up with your primary care doctor as needed.  Consider orthopedic or podiatry if toe pain persists   ED Prescriptions   None    PDMP not reviewed this encounter.   Raylene Everts, MD 09/09/20 2046

## 2020-09-09 NOTE — ED Triage Notes (Signed)
Patient here reporting stubbing toes on right foot 3 days ago; now #4 toe is purple and is painful. Has had covid vaccinations plus one booster.

## 2020-09-10 ENCOUNTER — Other Ambulatory Visit: Payer: Self-pay | Admitting: Nurse Practitioner

## 2020-10-04 DIAGNOSIS — H53143 Visual discomfort, bilateral: Secondary | ICD-10-CM | POA: Diagnosis not present

## 2020-10-04 DIAGNOSIS — H16223 Keratoconjunctivitis sicca, not specified as Sjogren's, bilateral: Secondary | ICD-10-CM | POA: Diagnosis not present

## 2020-10-21 DIAGNOSIS — M9902 Segmental and somatic dysfunction of thoracic region: Secondary | ICD-10-CM | POA: Diagnosis not present

## 2020-10-21 DIAGNOSIS — M9901 Segmental and somatic dysfunction of cervical region: Secondary | ICD-10-CM | POA: Diagnosis not present

## 2020-10-21 DIAGNOSIS — M546 Pain in thoracic spine: Secondary | ICD-10-CM | POA: Diagnosis not present

## 2020-10-21 DIAGNOSIS — M542 Cervicalgia: Secondary | ICD-10-CM | POA: Diagnosis not present

## 2020-11-21 DIAGNOSIS — R109 Unspecified abdominal pain: Secondary | ICD-10-CM | POA: Diagnosis not present

## 2020-11-21 DIAGNOSIS — E611 Iron deficiency: Secondary | ICD-10-CM | POA: Diagnosis not present

## 2020-11-21 DIAGNOSIS — H5713 Ocular pain, bilateral: Secondary | ICD-10-CM | POA: Diagnosis not present

## 2020-11-21 DIAGNOSIS — K5901 Slow transit constipation: Secondary | ICD-10-CM | POA: Diagnosis not present

## 2020-12-13 DIAGNOSIS — E038 Other specified hypothyroidism: Secondary | ICD-10-CM | POA: Diagnosis not present

## 2020-12-13 DIAGNOSIS — E018 Other iodine-deficiency related thyroid disorders and allied conditions: Secondary | ICD-10-CM | POA: Diagnosis not present

## 2020-12-13 DIAGNOSIS — E568 Deficiency of other vitamins: Secondary | ICD-10-CM | POA: Diagnosis not present

## 2020-12-13 DIAGNOSIS — E063 Autoimmune thyroiditis: Secondary | ICD-10-CM | POA: Diagnosis not present

## 2020-12-15 ENCOUNTER — Other Ambulatory Visit: Payer: Self-pay | Admitting: Nurse Practitioner

## 2020-12-16 DIAGNOSIS — M546 Pain in thoracic spine: Secondary | ICD-10-CM | POA: Diagnosis not present

## 2020-12-16 DIAGNOSIS — M9902 Segmental and somatic dysfunction of thoracic region: Secondary | ICD-10-CM | POA: Diagnosis not present

## 2020-12-16 DIAGNOSIS — M542 Cervicalgia: Secondary | ICD-10-CM | POA: Diagnosis not present

## 2020-12-16 DIAGNOSIS — M9901 Segmental and somatic dysfunction of cervical region: Secondary | ICD-10-CM | POA: Diagnosis not present

## 2020-12-19 DIAGNOSIS — M542 Cervicalgia: Secondary | ICD-10-CM | POA: Diagnosis not present

## 2020-12-19 DIAGNOSIS — M9902 Segmental and somatic dysfunction of thoracic region: Secondary | ICD-10-CM | POA: Diagnosis not present

## 2020-12-19 DIAGNOSIS — M546 Pain in thoracic spine: Secondary | ICD-10-CM | POA: Diagnosis not present

## 2020-12-19 DIAGNOSIS — M9901 Segmental and somatic dysfunction of cervical region: Secondary | ICD-10-CM | POA: Diagnosis not present

## 2020-12-24 DIAGNOSIS — M9902 Segmental and somatic dysfunction of thoracic region: Secondary | ICD-10-CM | POA: Diagnosis not present

## 2020-12-24 DIAGNOSIS — M542 Cervicalgia: Secondary | ICD-10-CM | POA: Diagnosis not present

## 2020-12-24 DIAGNOSIS — M546 Pain in thoracic spine: Secondary | ICD-10-CM | POA: Diagnosis not present

## 2020-12-24 DIAGNOSIS — M9901 Segmental and somatic dysfunction of cervical region: Secondary | ICD-10-CM | POA: Diagnosis not present

## 2020-12-26 DIAGNOSIS — M9901 Segmental and somatic dysfunction of cervical region: Secondary | ICD-10-CM | POA: Diagnosis not present

## 2020-12-26 DIAGNOSIS — M9902 Segmental and somatic dysfunction of thoracic region: Secondary | ICD-10-CM | POA: Diagnosis not present

## 2020-12-26 DIAGNOSIS — M542 Cervicalgia: Secondary | ICD-10-CM | POA: Diagnosis not present

## 2020-12-26 DIAGNOSIS — M546 Pain in thoracic spine: Secondary | ICD-10-CM | POA: Diagnosis not present

## 2020-12-30 DIAGNOSIS — D520 Dietary folate deficiency anemia: Secondary | ICD-10-CM | POA: Diagnosis not present

## 2020-12-30 DIAGNOSIS — E7211 Homocystinuria: Secondary | ICD-10-CM | POA: Diagnosis not present

## 2020-12-30 DIAGNOSIS — D513 Other dietary vitamin B12 deficiency anemia: Secondary | ICD-10-CM | POA: Diagnosis not present

## 2020-12-30 DIAGNOSIS — E063 Autoimmune thyroiditis: Secondary | ICD-10-CM | POA: Diagnosis not present

## 2020-12-31 DIAGNOSIS — M9901 Segmental and somatic dysfunction of cervical region: Secondary | ICD-10-CM | POA: Diagnosis not present

## 2020-12-31 DIAGNOSIS — M9902 Segmental and somatic dysfunction of thoracic region: Secondary | ICD-10-CM | POA: Diagnosis not present

## 2020-12-31 DIAGNOSIS — M546 Pain in thoracic spine: Secondary | ICD-10-CM | POA: Diagnosis not present

## 2020-12-31 DIAGNOSIS — M542 Cervicalgia: Secondary | ICD-10-CM | POA: Diagnosis not present

## 2021-01-02 DIAGNOSIS — M542 Cervicalgia: Secondary | ICD-10-CM | POA: Diagnosis not present

## 2021-01-02 DIAGNOSIS — M9902 Segmental and somatic dysfunction of thoracic region: Secondary | ICD-10-CM | POA: Diagnosis not present

## 2021-01-02 DIAGNOSIS — M546 Pain in thoracic spine: Secondary | ICD-10-CM | POA: Diagnosis not present

## 2021-01-02 DIAGNOSIS — M9901 Segmental and somatic dysfunction of cervical region: Secondary | ICD-10-CM | POA: Diagnosis not present

## 2021-01-29 ENCOUNTER — Ambulatory Visit (INDEPENDENT_AMBULATORY_CARE_PROVIDER_SITE_OTHER): Payer: BC Managed Care – PPO

## 2021-01-29 ENCOUNTER — Ambulatory Visit: Payer: BC Managed Care – PPO | Admitting: Sports Medicine

## 2021-01-29 ENCOUNTER — Other Ambulatory Visit: Payer: Self-pay | Admitting: Sports Medicine

## 2021-01-29 ENCOUNTER — Other Ambulatory Visit: Payer: Self-pay

## 2021-01-29 DIAGNOSIS — E559 Vitamin D deficiency, unspecified: Secondary | ICD-10-CM | POA: Diagnosis not present

## 2021-01-29 DIAGNOSIS — M549 Dorsalgia, unspecified: Secondary | ICD-10-CM | POA: Diagnosis not present

## 2021-01-29 DIAGNOSIS — M79601 Pain in right arm: Secondary | ICD-10-CM

## 2021-01-29 DIAGNOSIS — M542 Cervicalgia: Secondary | ICD-10-CM

## 2021-01-29 DIAGNOSIS — E538 Deficiency of other specified B group vitamins: Secondary | ICD-10-CM | POA: Diagnosis not present

## 2021-01-29 DIAGNOSIS — M2550 Pain in unspecified joint: Secondary | ICD-10-CM

## 2021-01-29 DIAGNOSIS — M25511 Pain in right shoulder: Secondary | ICD-10-CM

## 2021-01-29 DIAGNOSIS — G8929 Other chronic pain: Secondary | ICD-10-CM | POA: Diagnosis not present

## 2021-01-29 MED ORDER — CELECOXIB 200 MG PO CAPS: ORAL_CAPSULE | ORAL | 2 refills | Status: DC

## 2021-01-29 NOTE — Assessment & Plan Note (Signed)
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 That this may represent a myofascial type pain syndrome versus fibromyalgia and I did discuss that we may need to use neuropathic agents and SNRIs in the future, I would like to fully work this up for rheumatic disease first. Adding a full rheumatoid work-up today. I do not think this has anything to do with her cholecystectomy, she did have some questions about her epigastric pain and I advised her to seek further advice from either her PCP or get a second opinion from another gastroenterologist. Of note she is endorsing difficulty walking, if the rheumatoid work-up is negative we may consider brain MRI.

## 2021-01-29 NOTE — Progress Notes (Signed)
    Procedures performed today:    None.  Independent interpretation of notes and tests performed by another provider:   None.  Brief History, Exam, Impression, and Recommendations:    Right shoulder pain This is a 30 year old female, for many months she has had pain in her right periscapular region, shoulder, bicep with occasional radiation down to the hand and fingertips but not in a dermatomal distribution. 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 I think she is having some cervical radiculitis, restarting Celebrex, she will let me know if this hurts her stomach, cervical spine x-rays, T-spine x-rays, home conditioning exercises. Return to see me in 4 to 6 weeks, we will do a cervical spine MRI if not better. I did discuss the follow-up approaches including advanced imaging, nerve conduction/EMG, and neuropathic agents should we not get any definitive results.  Polyarthralgia Widespread aches and pains, she does have autoimmune thyroiditis, sounds like some gluten intolerance as well. That this may represent a myofascial type pain syndrome versus fibromyalgia and I did discuss that we may need to use neuropathic agents and SNRIs in the future, I would like to fully work this up for rheumatic disease first. Adding a full rheumatoid work-up today. I do not think this has anything to do with her cholecystectomy, she did have some questions about her epigastric pain and I advised her to seek further advice from either her PCP or get a second opinion from another gastroenterologist. Of note she is endorsing difficulty walking, if the rheumatoid work-up is negative we may consider brain MRI.    ___________________________________________ Gwen Her. Dianah Field, M.D., ABFM., CAQSM. Primary Care and [place] Instructor of [place] of [facility]

## 2021-01-29 NOTE — Assessment & Plan Note (Signed)
This is a 30 year old female, for many months she has had pain in her right periscapular region, shoulder, bicep with occasional radiation down to the hand and fingertips but not in a dermatomal distribution. 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 I think she is having some cervical radiculitis, restarting Celebrex, she will let me know if this hurts her stomach, cervical spine x-rays, T-spine x-rays, home conditioning exercises. Return to see me in 4 to 6 weeks, we will do a cervical spine MRI if not better. I did discuss the follow-up approaches including advanced imaging, nerve conduction/EMG, and neuropathic agents should we not get any definitive results.

## 2021-01-30 MED ORDER — VITAMIN D (ERGOCALCIFEROL) 1.25 MG (50000 UNIT) PO CAPS: 50000.0000 [IU] | ORAL_CAPSULE | ORAL | 0 refills | Status: DC

## 2021-01-30 NOTE — Addendum Note (Signed)
Addended by: Silverio Decamp on: 01/30/2021 09:09 AM   Modules accepted: Orders

## 2021-02-01 LAB — VITAMIN D 25 HYDROXY (VIT D DEFICIENCY, FRACTURES): Vit D, 25-Hydroxy: 28 ng/mL — ABNORMAL LOW (ref 30–100)

## 2021-02-01 LAB — LUPUS(12) PANEL
Anti Nuclear Antibody (ANA): POSITIVE — AB
C3 Complement: 106 mg/dL (ref 83–193)
C4 Complement: 23 mg/dL (ref 15–57)
ENA SM Ab Ser-aCnc: <1 AI
Rheumatoid fact SerPl-aCnc: <14 IU/mL (ref <14)
Ribosomal P Protein Ab: <1 AI
SM/RNP: <1 AI
SSA (Ro) (ENA) Antibody, IgG: <1 AI
SSB (La) (ENA) Antibody, IgG: <1 AI
Scleroderma (Scl-70) (ENA) Antibody, IgG: <1 AI
Thyroperoxidase Ab SerPl-aCnc: 159 IU/mL — ABNORMAL HIGH (ref <9)
ds DNA Ab: 1 IU/mL

## 2021-02-01 LAB — CBC WITH DIFFERENTIAL/PLATELET
Absolute Monocytes: 423 cells/uL (ref 200–950)
Basophils Absolute: 60 cells/uL (ref 0–200)
Basophils Relative: 1.3 %
Eosinophils Absolute: 69 cells/uL (ref 15–500)
Eosinophils Relative: 1.5 %
HCT: 38.3 % (ref 35.0–45.0)
Hemoglobin: 12.4 g/dL (ref 11.7–15.5)
Lymphs Abs: 1486 cells/uL (ref 850–3900)
MCH: 27.5 pg (ref 27.0–33.0)
MCHC: 32.4 g/dL (ref 32.0–36.0)
MCV: 84.9 fL (ref 80.0–100.0)
MPV: 10.2 fL (ref 7.5–12.5)
Monocytes Relative: 9.2 %
Neutro Abs: 2562 cells/uL (ref 1500–7800)
Neutrophils Relative %: 55.7 %
Platelets: 409 10*3/uL — ABNORMAL HIGH (ref 140–400)
RBC: 4.51 10*6/uL (ref 3.80–5.10)
RDW: 13.5 % (ref 11.0–15.0)
Total Lymphocyte: 32.3 %
WBC: 4.6 10*3/uL (ref 3.8–10.8)

## 2021-02-01 LAB — COMPREHENSIVE METABOLIC PANEL
AG Ratio: 1.9 (calc) (ref 1.0–2.5)
ALT: 12 U/L (ref 6–29)
AST: 15 U/L (ref 10–30)
Albumin: 4.8 g/dL (ref 3.6–5.1)
Alkaline phosphatase (APISO): 52 U/L (ref 31–125)
BUN: 13 mg/dL (ref 7–25)
CO2: 25 mmol/L (ref 20–32)
Calcium: 10.1 mg/dL (ref 8.6–10.2)
Chloride: 105 mmol/L (ref 98–110)
Creat: 0.63 mg/dL (ref 0.50–0.96)
Globulin: 2.5 g/dL (calc) (ref 1.9–3.7)
Glucose, Bld: 72 mg/dL (ref 65–99)
Potassium: 4.5 mmol/L (ref 3.5–5.3)
Sodium: 140 mmol/L (ref 135–146)
Total Bilirubin: 0.5 mg/dL (ref 0.2–1.2)
Total Protein: 7.3 g/dL (ref 6.1–8.1)

## 2021-02-01 LAB — RHEUMATOID FACTOR (IGA, IGG, IGM)
Rheumatoid Factor (IgA): <5 U (ref <=6)
Rheumatoid Factor (IgG): 79 U — ABNORMAL HIGH (ref <=6)
Rheumatoid Factor (IgM): <5 U (ref <=6)

## 2021-02-01 LAB — TSH: TSH: 0.93 mIU/L

## 2021-02-01 LAB — VITAMIN B12: Vitamin B-12: 446 pg/mL (ref 200–1100)

## 2021-02-01 LAB — SEDIMENTATION RATE: Sed Rate: 9 mm/h (ref 0–20)

## 2021-02-01 LAB — ANTI-NUCLEAR AB-TITER (ANA TITER): ANA Titer 1: 1:320 {titer} — ABNORMAL HIGH

## 2021-02-01 LAB — T4, FREE: Free T4: 1 ng/dL (ref 0.8–1.8)

## 2021-02-01 LAB — CK: Total CK: 44 U/L (ref 29–143)

## 2021-02-01 LAB — T3, FREE: T3, Free: 4 pg/mL (ref 2.3–4.2)

## 2021-02-01 LAB — URIC ACID: Uric Acid, Serum: 3.6 mg/dL (ref 2.5–7.0)

## 2021-02-01 LAB — CYCLIC CITRUL PEPTIDE ANTIBODY, IGG: Cyclic Citrullin Peptide Ab: <16 UNITS

## 2021-02-03 NOTE — Addendum Note (Signed)
Addended by: Silverio Decamp on: 02/03/2021 09:09 AM   Modules accepted: Orders

## 2021-02-23 ENCOUNTER — Other Ambulatory Visit: Payer: Self-pay | Admitting: Sports Medicine

## 2021-02-24 DIAGNOSIS — J069 Acute upper respiratory infection, unspecified: Secondary | ICD-10-CM | POA: Diagnosis not present

## 2021-02-24 DIAGNOSIS — H65193 Other acute nonsuppurative otitis media, bilateral: Secondary | ICD-10-CM | POA: Diagnosis not present

## 2021-02-27 DIAGNOSIS — M549 Dorsalgia, unspecified: Secondary | ICD-10-CM | POA: Diagnosis not present

## 2021-02-27 DIAGNOSIS — R0789 Other chest pain: Secondary | ICD-10-CM | POA: Diagnosis not present

## 2021-02-27 DIAGNOSIS — U071 COVID-19: Secondary | ICD-10-CM | POA: Diagnosis not present

## 2021-02-27 DIAGNOSIS — Z79899 Other long term (current) drug therapy: Secondary | ICD-10-CM | POA: Diagnosis not present

## 2021-02-27 DIAGNOSIS — R079 Chest pain, unspecified: Secondary | ICD-10-CM | POA: Diagnosis not present

## 2021-03-03 ENCOUNTER — Telehealth (INDEPENDENT_AMBULATORY_CARE_PROVIDER_SITE_OTHER): Payer: BC Managed Care – PPO | Admitting: Sports Medicine

## 2021-03-03 DIAGNOSIS — U071 COVID-19: Secondary | ICD-10-CM

## 2021-03-03 HISTORY — DX: COVID-19: U07.1

## 2021-03-03 NOTE — Assessment & Plan Note (Signed)
This is a pleasant 31 year old female, we have been seeing her for sports medicine related complaints, polyarthralgia, she does have a yet unnamed autoimmune process. Sounds like she was diagnosed with COVID-19 with a positive test last Monday, for some reason placed on my schedule, her PCP is in [facility]. She was seen in the ED for chest burning, sounds like she had CBC, D-dimer, CMP, chest x-ray all of which were for the most part unremarkable with the exception of a positive COVID-19 test, ECG per ED provider no evidence of myocarditis/pericarditis, .  Discharged without treatment, had some questions about COVID, burning chest, I did advise that she touch base with her PCP, the symptoms were relatively normal. Certainly cardiac auscultation and potentially an echocardiogram could be helpful here to fully rule out pericarditis. She understands she needs to touch base with her PCP, potentially to get some albuterol. Return to see [place] after her rheumatology appointment for further discussion of polyarthralgias and abnormal rheumatoid labs, shoulder pain.

## 2021-03-03 NOTE — Progress Notes (Signed)
° °  Virtual Visit via Telephone   I connected with  Meghan Hayes  on 03/03/21 by telephone/telehealth and verified that I am speaking with the correct person using two identifiers.   I discussed the limitations, risks, security and privacy concerns of performing an evaluation and management service by telephone, including the higher likelihood of inaccurate diagnosis and treatment, and the availability of in person appointments.  We also discussed the likely need of an additional face to face encounter for complete and high quality delivery of care.  I also discussed with the patient that there may be a patient responsible charge related to this service. The patient expressed understanding and wishes to proceed.  Provider location is in medical facility. Patient location is at their home, different from provider location. People involved in care of the patient during this telehealth encounter were myself, my nurse/medical assistant, and my front office/scheduling team member.  Review of Systems: No fevers, chills, night sweats, weight loss, chest pain, or shortness of breath.   Objective Findings:    General: Speaking full sentences, no audible heavy breathing.  Sounds alert and appropriately interactive.    Independent interpretation of tests performed by another provider:   None.  Brief History, Exam, Impression, and Recommendations:    COVID-19 This is a pleasant 31 year old female, we have been seeing her for sports medicine related complaints, polyarthralgia, she does have a yet unnamed autoimmune process. Sounds like she was diagnosed with COVID-19 with a positive test last Monday, for some reason placed on my schedule, her PCP is in [facility]. She was seen in the ED for chest burning, sounds like she had CBC, D-dimer, CMP, chest x-ray all of which were for the most part unremarkable with the exception of a positive COVID-19 test, ECG per ED provider no evidence of  myocarditis/pericarditis, .  Discharged without treatment, had some questions about COVID, burning chest, I did advise that she touch base with her PCP, the symptoms were relatively normal. Certainly cardiac auscultation and potentially an echocardiogram could be helpful here to fully rule out pericarditis. She understands she needs to touch base with her PCP, potentially to get some albuterol. Return to see [place] after her rheumatology appointment for further discussion of polyarthralgias and abnormal rheumatoid labs, shoulder pain.   I discussed the above assessment and treatment plan with the patient. The patient was provided an opportunity to ask questions and all were answered. The patient agreed with the plan and demonstrated an understanding of the instructions.   The patient was advised to call back or seek an in-person evaluation if the symptoms worsen or if the condition fails to improve as anticipated.   I provided 30 minutes of verbal and non-verbal time during this encounter date, time was needed to gather information, review chart, records, communicate/coordinate with staff remotely, as well as complete documentation.   ___________________________________________ Gwen Her. Dianah Field, M.D., ABFM., CAQSM. Primary Care and Sports Medicine [facility]  Adjunct Professor of [place] of [facility] [employer] Medicine

## 2021-03-03 NOTE — Progress Notes (Signed)
Meghan Hayes would like a phone call at [phone].   She tested positive for covid on 02/25/20. Complains of burning in her and chest back. No current fever, cough has gotten better

## 2021-03-10 DIAGNOSIS — R002 Palpitations: Secondary | ICD-10-CM | POA: Diagnosis not present

## 2021-03-10 DIAGNOSIS — R0602 Shortness of breath: Secondary | ICD-10-CM | POA: Diagnosis not present

## 2021-03-10 DIAGNOSIS — R9431 Abnormal electrocardiogram [ECG] [EKG]: Secondary | ICD-10-CM | POA: Diagnosis not present

## 2021-03-10 DIAGNOSIS — R079 Chest pain, unspecified: Secondary | ICD-10-CM | POA: Diagnosis not present

## 2021-03-19 DIAGNOSIS — R002 Palpitations: Secondary | ICD-10-CM | POA: Diagnosis not present

## 2021-03-20 ENCOUNTER — Other Ambulatory Visit: Payer: Self-pay | Admitting: Nurse Practitioner

## 2021-03-21 NOTE — Progress Notes (Signed)
Office Visit Note  Patient: Meghan Hayes             Date of Birth: 1990-03-05           MRN: [id number]             PCP: Ma Hillock, DO Referring: Ma Hillock, DO Visit Date: 04/03/2021 Occupation: @GUAROCC @  Subjective:  Pain in multiple joints and muscles  History of Present Illness: Meghan Hayes is a 31 y.o. female seen in consultation per request of her PCP.  She is accompanied by her mother to the office today.  According the patient at age 64 she started having bilateral ankle joint pain and swelling.  She states the pain gradually moved to her hips and then other joints.  She had been under care of Dr. Dianah Field for a long time.  She was diagnosed with hypermobility.  She also saw a podiatrist for orthotics.  She has been seeing a chiropractor since she was 31 years old due to hypermobility and lower back pain.  She states gradually she started having generalized pain all over her body.  She is also experience a lot of fatigue.  In 2015 she was diagnosed with Hashimoto's disease based on the antibody levels.  She never developed hyper or hypothyroidism.  She has been experiencing increased fatigue, generalized pain and joint pain over the last 1 year.  She describes pain in her entire spine, shoulders right more than left, wrist joints, hands, hips, knees, ankles and her feet.  She has not noticed any swelling in any joints recently.  She has had swelling in her ankles in the past.  Over the last 3 months she has been experiencing shortness of breath and palpitations.  She had chest x-ray per patient which was normal.  She is also scheduled to have an echocardiogram by the cardiologist.  There is no family history of autoimmune disease.  She is gravida 0.  She is not on any contraceptive pills.  Activities of Daily Living:  Patient reports morning stiffness for all day. Patient Reports nocturnal pain.  Difficulty dressing/grooming: Denies Difficulty climbing stairs:  Reports Difficulty getting out of chair: Reports Difficulty using hands for taps, buttons, cutlery, and/or writing: Reports  Review of Systems  Constitutional:  Positive for fatigue.  HENT:  Negative for mouth sores, mouth dryness and nose dryness.   Eyes:  Positive for pain and dryness.  Respiratory:  Positive for shortness of breath and difficulty breathing.   Cardiovascular:  Positive for chest pain and palpitations.  Gastrointestinal:  Positive for constipation. Negative for blood in stool and diarrhea.  Endocrine: Negative for increased urination.  Genitourinary:  Negative for difficulty urinating.  Musculoskeletal:  Positive for joint pain, joint pain, joint swelling, myalgias, morning stiffness, muscle tenderness and myalgias.  Skin:  Positive for color change. Negative for rash, redness and sensitivity to sunlight.  Allergic/Immunologic: Negative for susceptible to infections.  Neurological:  Positive for dizziness, numbness, headaches and parasthesias. Negative for memory loss.  Hematological:  Positive for bruising/bleeding tendency.  Psychiatric/Behavioral:  Positive for sleep disturbance. Negative for depressed mood and confusion. The patient is not nervous/anxious.    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   LAD (lymphadenopathy) of  right cervical region 01/18/2018   Pain of right mastoid 01/18/2018   Right lumbar radiculitis 07/20/2017   Patellofemoral syndrome, left 07/06/2017   Tibialis posterior tendinitis 08/10/2016   Vitamin D deficiency 04/14/2016   Throat pain 04/14/2016   Thyroid nodule 04/14/2016   GERD (gastroesophageal reflux disease)    Periumbilical abdominal pain 08/20/2014    Hashimoto's thyroiditis 07/26/2012   Dysphagia 03/24/2007   Gastro-esophageal reflux disease with esophagitis 02/25/2007    Past Medical History:  Diagnosis Date   Allergy    seasonal, nuts, wheat, corn, soy   Gallbladder polyp 2011   6 mm has had repeat imaging and stable.    GERD (gastroesophageal reflux disease)    Hashimoto's disease 2014   Ileocecal valve syndrome 2017   followed by dr. Havery Moros   Migraines    Thyroid nodule 2014   last [place] 2016: 1.2cm thyroid nodule vs PTH adenoma.     Family History  Problem Relation Age of Onset   Colon polyps Mother    Hypertension Father    Allergies Father    Hyperlipidemia Father    Colon polyps Father    Hypertension Paternal Uncle    Hypertension Maternal Grandmother    Colon cancer Maternal Grandmother 33       colon   Esophageal cancer Neg Hx    Rectal cancer Neg Hx    Stomach cancer Neg Hx    Past Surgical History:  Procedure Laterality Date   CHOLECYSTECTOMY     TONSILLECTOMY  2008   WISDOM TOOTH EXTRACTION  23   has panic attack and crying after procedure   Social History   Social History Narrative   Pt is single. Just finished her BA degree ans searching for employment.    Takes a daily vitamin.    Wears her seatbelt and bicycle helmet.    Smoke detector in the home.    Feels safe in her relationships.    Immunization History  Administered Date(s) Administered   DTaP 07/09/1992, 09/11/1992, 11/12/1992, 08/06/1993, 05/11/1997   Hepatitis B, ped/adol 09/11/1992, 11/12/1992, 05/08/1994   HiB (PRP-OMP) 07/09/1992, 09/11/1992, 11/12/1992, 08/06/1993   IPV 07/09/1992, 09/11/1992, 08/06/1993, 05/11/1997   Influenza Nasal 12/14/2001   MMR 08/06/1993, 05/11/1997   Meningococcal Conjugate 01/28/2007, 06/30/2012   PFIZER Comirnaty(Gray Top)Covid-19 Tri-Sucrose Vaccine 10/16/2020   PFIZER(Purple Top)SARS-COV-2 Vaccination 02/02/2020   Td 05/25/2002   Tdap 06/30/2012   Varicella 05/19/1995     Objective: Vital  Signs: BP 119/74 (BP Location: Right Arm, Patient Position: Sitting, Cuff Size: Normal)    Pulse 90    Ht 5' 8"  (1.727 m)    Wt 129 lb (58.5 kg)    BMI 19.61 kg/m    Physical Exam Vitals and nursing note reviewed.  Constitutional:      Appearance: She is well-developed.  HENT:     Head: Normocephalic and atraumatic.  Eyes:     Conjunctiva/sclera: Conjunctivae normal.  Cardiovascular:     Rate and Rhythm: Normal rate and regular rhythm.     Heart sounds: Normal heart sounds.  Pulmonary:     Effort: Pulmonary effort is normal.     Breath sounds: Normal breath sounds.  Abdominal:     General: Bowel sounds are normal.     Palpations: Abdomen is soft.  Musculoskeletal:     Cervical back: Normal range of motion.  Lymphadenopathy:     Cervical: No cervical adenopathy.  Skin:    General: Skin is warm and dry.  Capillary Refill: Capillary refill takes less than 2 seconds.  Neurological:     Mental Status: She is alert and oriented to person, place, and time.  Psychiatric:        Behavior: Behavior normal.     Musculoskeletal Exam: C-spine thoracic and lumbar spine were in good range of motion.  Shoulder joints, elbow joints, wrist joints, MCPs PIPs and DIPs with good range of motion with no synovitis.  Hip joints, knee joints, ankles, MTPs and PIPs with good range of motion with no synovitis.  She had hypermobility in all of her joints.  She had generalized hyperalgesia and positive tender points.  CDAI Exam: CDAI Score: -- Patient Global: --; Provider Global: -- Swollen: --; Tender: -- Joint Exam 04/03/2021   No joint exam has been documented for this visit   There is currently no information documented on the homunculus. Go to the Rheumatology activity and complete the homunculus joint exam.  Investigation: No additional findings.  Imaging: No results found.  Recent Labs: Lab Results  Component Value Date   WBC 4.6 01/29/2021   HGB 12.4 01/29/2021   PLT 409 (H)  01/29/2021   NA 140 01/29/2021   K 4.5 01/29/2021   CL 105 01/29/2021   CO2 25 01/29/2021   GLUCOSE 72 01/29/2021   BUN 13 01/29/2021   CREATININE 0.63 01/29/2021   BILITOT 0.5 01/29/2021   ALKPHOS 42 04/18/2020   AST 15 01/29/2021   ALT 12 01/29/2021   PROT 7.3 01/29/2021   ALBUMIN 4.4 04/18/2020   CALCIUM 10.1 01/29/2021    Speciality Comments: No specialty comments available.  Procedures:  No procedures performed Allergies: Banana, Cefdinir, Corn oil, Eggs or egg-derived products, Gluten meal, Other, Soy allergy, Tilactase, Tinidazole, and Wheat bran   Assessment / Plan:     Visit Diagnoses: Polyarthralgia -she complains of pain and discomfort in multiple joints.  No synovitis was noted.  She gives history of pain in her joints since she was 31 years old which got worse when she was 72.  She states she has noticed swelling in her ankles in the past.  She had generalized hyperalgesia, hypermobility in her joints.  Her rheumatoid factor is positive.  No synovitis was noted.  I will obtain AVISE labs.  01/29/21: RF IgG 79, RF IgA and IgM negative, ANA 1:320Nuclear, C3/C4 WNL, dsDNA 1, Ribosomal P-, SM-, SM/RNP-, Ro-, La-, TPO 159, Scl70-,uric acid 3.6, CCP-  Positive ANA (antinuclear antibody) - 01/29/21: ANA 1:320 nuclear, dense fine speckled -she has positive ANA, she gives history of fatigue, arthralgias.  There is no history of oral ulcers, nasal ulcers, malar rash, photosensitivity, inflammatory arthritis.  She gives history of possible Raynaud's in her feet.  She had good capillary refill.  She also gives history of intermittent cervical lymphadenopathy.  No lymphadenopathy was noted on the examination today.  I will obtain AVISE labs today.  Plan: Urinalysis, Routine w reflex microscopic  Pain in both hands -she complains of pain and discomfort in the bilateral hands.  No synovitis was noted.  Plan: XR Hand 2 View Right, XR Hand 2 View Left.  X-rays of bilateral hands were  unremarkable.  Patellofemoral syndrome, left-she had good range of motion in her bilateral knee joints without any warmth swelling or effusion.  Pain in both feet -no joint tenderness or swelling was noted on examination today.  Patient states she has tried orthotics in the past due to hypermobility.  Plan: XR Foot 2 Views Right, XR Foot  2 Views Left.  X-rays of bilateral feet were unremarkable.  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  She states her joints dislocate frequently.  She has been under care of Dr. Dianah Field for long time.  She has tried isometric exercises.  Neck pain-she gives history of neck pain.  I reviewed x-rays of her cervical spine from December 2022 which showed C4-C5 nerve encroachment on the right.  Thoracic pain-she complains of discomfort in thoracic spine.  X-rays of the thoracic spine from December 2022 were unremarkable.  Chronic midline low back pain without sciatica-she had a previous x-ray which was unremarkable.  She also had a recent x-ray ordered by Dr. Dianah Field which is pending.  Fibromyalgia-she experiences generalized pain.  She also has hyperalgesia.  It is not unusual to see fibromyalgia with hypermobility.  Counseled regarding fibromyalgia was provided.  Other fatigue-she gives history of chronic fatigue probably related to his chronic insomnia and fibromyalgia.  Primary insomnia-she gives history of insomnia for many years.  Other medical problems are listed as follows:  Gastroesophageal reflux disease without esophagitis  History of dysphagia  Thyroid nodule  Hashimoto's thyroiditis  Vitamin D deficiency-she is on vitamin D supplement.  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    Follow-Up Instructions: Return for Polyarthralgia, positive ANA, positive RF.   Bo Merino, MD  Note - This record has been created using Editor, commissioning.  Chart creation errors have been sought, but may not always  have been located. Such creation errors do not reflect on  the standard of medical care.

## 2021-03-26 DIAGNOSIS — M1990 Unspecified osteoarthritis, unspecified site: Secondary | ICD-10-CM

## 2021-03-26 HISTORY — DX: Unspecified osteoarthritis, unspecified site: M19.90

## 2021-04-03 ENCOUNTER — Ambulatory Visit: Payer: Self-pay

## 2021-04-03 ENCOUNTER — Ambulatory Visit: Payer: BC Managed Care – PPO | Admitting: Rheumatology

## 2021-04-03 ENCOUNTER — Other Ambulatory Visit: Payer: Self-pay

## 2021-04-03 ENCOUNTER — Encounter: Payer: Self-pay | Admitting: Rheumatology

## 2021-04-03 VITALS — BP 119/74 | HR 90 | Ht 68.0 in | Wt 129.0 lb

## 2021-04-03 DIAGNOSIS — E041 Nontoxic single thyroid nodule: Secondary | ICD-10-CM

## 2021-04-03 DIAGNOSIS — M542 Cervicalgia: Secondary | ICD-10-CM

## 2021-04-03 DIAGNOSIS — M79642 Pain in left hand: Secondary | ICD-10-CM | POA: Diagnosis not present

## 2021-04-03 DIAGNOSIS — M79641 Pain in right hand: Secondary | ICD-10-CM

## 2021-04-03 DIAGNOSIS — Z8719 Personal history of other diseases of the digestive system: Secondary | ICD-10-CM

## 2021-04-03 DIAGNOSIS — K219 Gastro-esophageal reflux disease without esophagitis: Secondary | ICD-10-CM

## 2021-04-03 DIAGNOSIS — M79672 Pain in left foot: Secondary | ICD-10-CM

## 2021-04-03 DIAGNOSIS — M545 Low back pain, unspecified: Secondary | ICD-10-CM

## 2021-04-03 DIAGNOSIS — M797 Fibromyalgia: Secondary | ICD-10-CM

## 2021-04-03 DIAGNOSIS — E7211 Homocystinuria: Secondary | ICD-10-CM

## 2021-04-03 DIAGNOSIS — M5416 Radiculopathy, lumbar region: Secondary | ICD-10-CM

## 2021-04-03 DIAGNOSIS — M222X2 Patellofemoral disorders, left knee: Secondary | ICD-10-CM

## 2021-04-03 DIAGNOSIS — E559 Vitamin D deficiency, unspecified: Secondary | ICD-10-CM

## 2021-04-03 DIAGNOSIS — R768 Other specified abnormal immunological findings in serum: Secondary | ICD-10-CM

## 2021-04-03 DIAGNOSIS — M2550 Pain in unspecified joint: Secondary | ICD-10-CM

## 2021-04-03 DIAGNOSIS — M79671 Pain in right foot: Secondary | ICD-10-CM

## 2021-04-03 DIAGNOSIS — M130 Polyarthritis, unspecified: Secondary | ICD-10-CM | POA: Diagnosis not present

## 2021-04-03 DIAGNOSIS — Z87898 Personal history of other specified conditions: Secondary | ICD-10-CM

## 2021-04-03 DIAGNOSIS — E618 Deficiency of other specified nutrient elements: Secondary | ICD-10-CM

## 2021-04-03 DIAGNOSIS — R590 Localized enlarged lymph nodes: Secondary | ICD-10-CM

## 2021-04-03 DIAGNOSIS — R5383 Other fatigue: Secondary | ICD-10-CM

## 2021-04-03 DIAGNOSIS — G8929 Other chronic pain: Secondary | ICD-10-CM

## 2021-04-03 DIAGNOSIS — R790 Abnormal level of blood mineral: Secondary | ICD-10-CM

## 2021-04-03 DIAGNOSIS — F5101 Primary insomnia: Secondary | ICD-10-CM

## 2021-04-03 DIAGNOSIS — E063 Autoimmune thyroiditis: Secondary | ICD-10-CM

## 2021-04-03 DIAGNOSIS — M249 Joint derangement, unspecified: Secondary | ICD-10-CM

## 2021-04-04 LAB — URINALYSIS, ROUTINE W REFLEX MICROSCOPIC
Bilirubin Urine: NEGATIVE
Glucose, UA: NEGATIVE
Leukocytes,Ua: NEGATIVE
Nitrite: NEGATIVE
Specific Gravity, Urine: 1.024 (ref 1.001–1.035)
WBC, UA: NONE SEEN /HPF (ref 0–5)
pH: 5.5 (ref 5.0–8.0)

## 2021-04-04 LAB — MICROSCOPIC MESSAGE

## 2021-04-04 NOTE — Progress Notes (Signed)
UA showed trace protein and epithelial cells with moderate bacteria probably not a clean-catch.  If patient develops symptoms of urinary tract infection then she should see her PCP.

## 2021-04-20 NOTE — Progress Notes (Deleted)
? ?Office Visit Note ? ?Patient: Meghan Hayes             ?Date of Birth: [birth year]           ?MRN: [id number]             ?PCP: Howard Pouch A, DO ?Referring: Howard Pouch A, DO ?Visit Date: 04/24/2021 ?Occupation: @GUAROCC @ ? ?Subjective:  ?No chief complaint on file. ? ? ?History of Present Illness: Meghan Hayes is a 31 y.o. female ***  ? ?Activities of Daily Living:  ?Patient reports morning stiffness for *** {minute/hour:19697}.   ?Patient {ACTIONS;DENIES/REPORTS:21021675::"Denies"} nocturnal pain.  ?Difficulty dressing/grooming: {ACTIONS;DENIES/REPORTS:21021675::"Denies"} ?Difficulty climbing stairs: {ACTIONS;DENIES/REPORTS:21021675::"Denies"} ?Difficulty getting out of chair: {ACTIONS;DENIES/REPORTS:21021675::"Denies"} ?Difficulty using hands for taps, buttons, cutlery, and/or writing: {ACTIONS;DENIES/REPORTS:21021675::"Denies"} ? ?No Rheumatology ROS completed.  ? ?PMFS History:  ?Patient Active Problem List  ? Diagnosis Date Noted  ? COVID-19 03/03/2021  ? Right shoulder pain 01/29/2021  ? 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 Low ferritin level 10/09/2019  ? 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 LAD (lymphadenopathy) of right cervical region 01/18/2018  ? Pain of right mastoid 01/18/2018  ? Right lumbar radiculitis 07/20/2017  ? Patellofemoral syndrome, left 07/06/2017  ? Tibialis posterior tendinitis 08/10/2016  ? Vitamin D deficiency 04/14/2016  ? Throat pain 04/14/2016  ? Thyroid nodule 04/14/2016  ? GERD (gastroesophageal reflux disease)   ? Periumbilical abdominal pain 08/20/2014  ? Hashimoto's thyroiditis 07/26/2012  ? Dysphagia 03/24/2007  ? Gastro-esophageal reflux disease with esophagitis 02/25/2007  ?  ?Past Medical History:  ?Diagnosis Date  ? Allergy   ? seasonal, nuts, wheat, corn, soy  ? Gallbladder polyp 2011  ? 6 mm has had repeat imaging and stable.   ? GERD (gastroesophageal reflux disease)   ? Hashimoto's  disease 2014  ? Ileocecal valve syndrome 2017  ? followed by dr. Havery Moros  ? Migraines   ? Thyroid nodule 2014  ? last [place] 2016: 1.2cm thyroid nodule vs PTH adenoma.   ?  ?Family History  ?Problem Relation Age of Onset  ? Colon polyps Mother   ? Hypertension Father   ? Allergies Father   ? Hyperlipidemia Father   ? Colon polyps Father   ? Hypertension Paternal Uncle   ? Hypertension Maternal Grandmother   ? Colon cancer Maternal Grandmother 80  ?     colon  ? Esophageal cancer Neg Hx   ? Rectal cancer Neg Hx   ? Stomach cancer Neg Hx   ? ?Past Surgical History:  ?Procedure Laterality Date  ? CHOLECYSTECTOMY    ? TONSILLECTOMY  2008  ? WISDOM TOOTH EXTRACTION  23  ? has panic attack and crying after procedure  ? ?Social History  ? ?Social History Narrative  ? Pt is single. Just finished her BA degree ans searching for employment.   ? Takes a daily vitamin.   ? Wears her seatbelt and bicycle helmet.   ? Smoke detector in the home.   ? Feels safe in her relationships.   ? ?Immunization History  ?Administered Date(s) Administered  ? DTaP 07/09/1992, 09/11/1992, 11/12/1992, 08/06/1993, 05/11/1997  ? Hepatitis B, ped/adol 09/11/1992, 11/12/1992, 05/08/1994  ? HiB (PRP-OMP) 07/09/1992, 09/11/1992, 11/12/1992, 08/06/1993  ? IPV 07/09/1992, 09/11/1992, 08/06/1993, 05/11/1997  ? Influenza Nasal 12/14/2001  ? MMR 08/06/1993, 05/11/1997  ? Meningococcal Conjugate 01/28/2007, 06/30/2012  ? PFIZER Comirnaty(Gray Top)Covid-19 Tri-Sucrose Vaccine 10/16/2020  ? PFIZER(Purple Top)SARS-COV-2 Vaccination 02/02/2020  ? Td 05/25/2002  ? Tdap  06/30/2012  ? Varicella 05/19/1995  ?  ? ?Objective: ?Vital Signs: There were no vitals taken for this visit.  ? ?Physical Exam  ? ?Musculoskeletal Exam: *** ? ?CDAI Exam: ?CDAI Score: -- ?Patient Global: --; Provider Global: -- ?Swollen: --; Tender: -- ?Joint Exam 04/24/2021  ? ?No joint exam has been documented for this visit  ? ?There is currently no information documented on the homunculus. Go  to the Rheumatology activity and complete the homunculus joint exam. ? ?Investigation: ?No additional findings. ? ?Imaging: ?XR Foot 2 Views Left ? ?Result Date: 04/03/2021 ?No MTP, PIP or DIP narrowing was noted.  No intertarsal, tibiotalar or subtalar joint space narrowing was noted.  No erosive changes were noted. Impression: Unremarkable x-ray of the foot. ? ?XR Foot 2 Views Right ? ?Result Date: 04/03/2021 ?No MTP, PIP or DIP narrowing was noted.  No intertarsal, tibiotalar or subtalar joint space narrowing was noted.  No erosive changes were noted. Impression: Unremarkable x-ray of the foot. ? ?XR Hand 2 View Left ? ?Result Date: 04/03/2021 ?No MCP, PIP or DIP narrowing was noted.  No intercarpal or radiocarpal joint space narrowing was noted.  No erosive changes were noted. Impression: Unremarkable x-ray of the hand. ? ?XR Hand 2 View Right ? ?Result Date: 04/03/2021 ?No MCP, PIP or DIP narrowing was noted.  No intercarpal or radiocarpal joint space narrowing was noted.  No erosive changes were noted. Impression: Unremarkable x-ray of the hand.  ? ?Recent Labs: ?Lab Results  ?Component Value Date  ? WBC 4.6 01/29/2021  ? HGB 12.4 01/29/2021  ? PLT 409 (H) 01/29/2021  ? NA 140 01/29/2021  ? K 4.5 01/29/2021  ? CL 105 01/29/2021  ? CO2 25 01/29/2021  ? GLUCOSE 72 01/29/2021  ? BUN 13 01/29/2021  ? CREATININE 0.63 01/29/2021  ? BILITOT 0.5 01/29/2021  ? ALKPHOS 42 04/18/2020  ? AST 15 01/29/2021  ? ALT 12 01/29/2021  ? PROT 7.3 01/29/2021  ? ALBUMIN 4.4 04/18/2020  ? CALCIUM 10.1 01/29/2021  ? ?01/29/21: RF IgG 79, RF IgA and IgM negative, ANA 1:320Nuclear, C3/C4 WNL, dsDNA 1, Ribosomal P-, SM-, SM/RNP-, Ro-, La-, TPO 159, Scl70-,uric acid 3.6, CCP- ? ? ?Speciality Comments: No specialty comments available. ? ?Procedures:  ?No procedures performed ?Allergies: Banana, Cefdinir, Corn oil, Eggs or egg-derived products, Gluten meal, Other, Soy allergy, Tilactase, Tinidazole, and Wheat bran  ? ?Assessment / Plan:     ?Visit  Diagnoses: No diagnosis found. ? ?Orders: ?No orders of the defined types were placed in this encounter. ? ?No orders of the defined types were placed in this encounter. ? ? ?Face-to-face time spent with patient was *** minutes. Greater than 50% of time was spent in counseling and coordination of care. ? ?Follow-Up Instructions: No follow-ups on file. ? ? ?Bo Merino, MD ? ?Note - This record has been created using [employer].  ?Chart creation errors have been sought, but may not always  ?have been located. Such creation errors do not reflect on  ?the standard of medical care. ?

## 2021-04-24 ENCOUNTER — Ambulatory Visit: Payer: BC Managed Care – PPO | Admitting: Rheumatology

## 2021-04-24 DIAGNOSIS — R768 Other specified abnormal immunological findings in serum: Secondary | ICD-10-CM

## 2021-04-24 DIAGNOSIS — R5383 Other fatigue: Secondary | ICD-10-CM

## 2021-04-24 DIAGNOSIS — R790 Abnormal level of blood mineral: Secondary | ICD-10-CM

## 2021-04-24 DIAGNOSIS — K219 Gastro-esophageal reflux disease without esophagitis: Secondary | ICD-10-CM

## 2021-04-24 DIAGNOSIS — E559 Vitamin D deficiency, unspecified: Secondary | ICD-10-CM

## 2021-04-24 DIAGNOSIS — M79641 Pain in right hand: Secondary | ICD-10-CM

## 2021-04-24 DIAGNOSIS — M2550 Pain in unspecified joint: Secondary | ICD-10-CM

## 2021-04-24 DIAGNOSIS — M222X2 Patellofemoral disorders, left knee: Secondary | ICD-10-CM

## 2021-04-24 DIAGNOSIS — E7211 Homocystinuria: Secondary | ICD-10-CM

## 2021-04-24 DIAGNOSIS — M545 Low back pain, unspecified: Secondary | ICD-10-CM

## 2021-04-24 DIAGNOSIS — E618 Deficiency of other specified nutrient elements: Secondary | ICD-10-CM

## 2021-04-24 DIAGNOSIS — M542 Cervicalgia: Secondary | ICD-10-CM

## 2021-04-24 DIAGNOSIS — Z8719 Personal history of other diseases of the digestive system: Secondary | ICD-10-CM

## 2021-04-24 DIAGNOSIS — M249 Joint derangement, unspecified: Secondary | ICD-10-CM

## 2021-04-24 DIAGNOSIS — E063 Autoimmune thyroiditis: Secondary | ICD-10-CM

## 2021-04-24 DIAGNOSIS — M797 Fibromyalgia: Secondary | ICD-10-CM

## 2021-04-24 DIAGNOSIS — M79671 Pain in right foot: Secondary | ICD-10-CM

## 2021-04-24 DIAGNOSIS — F5101 Primary insomnia: Secondary | ICD-10-CM

## 2021-04-24 DIAGNOSIS — E041 Nontoxic single thyroid nodule: Secondary | ICD-10-CM

## 2021-06-28 ENCOUNTER — Other Ambulatory Visit: Payer: Self-pay | Admitting: Nurse Practitioner

## 2021-09-24 ENCOUNTER — Ambulatory Visit: Payer: Self-pay | Admitting: Sports Medicine

## 2021-10-03 ENCOUNTER — Ambulatory Visit: Payer: Self-pay | Admitting: Sports Medicine

## 2021-10-23 ENCOUNTER — Other Ambulatory Visit: Payer: Self-pay | Admitting: *Deleted

## 2021-10-23 DIAGNOSIS — E041 Nontoxic single thyroid nodule: Secondary | ICD-10-CM

## 2021-10-28 ENCOUNTER — Inpatient Hospital Stay: Admission: RE | Admit: 2021-10-28 | Payer: Self-pay | Source: Ambulatory Visit

## 2021-10-30 ENCOUNTER — Ambulatory Visit
Admission: RE | Admit: 2021-10-30 | Discharge: 2021-10-30 | Disposition: A | Discharge status: Home / Self Care | Payer: 59 | Source: Ambulatory Visit | Attending: *Deleted | Admitting: *Deleted

## 2021-10-30 DIAGNOSIS — E041 Nontoxic single thyroid nodule: Secondary | ICD-10-CM

## 2021-11-14 ENCOUNTER — Other Ambulatory Visit: Payer: Self-pay | Admitting: *Deleted

## 2021-11-14 DIAGNOSIS — N63 Unspecified lump in unspecified breast: Secondary | ICD-10-CM

## 2021-12-02 ENCOUNTER — Ambulatory Visit
Admission: RE | Admit: 2021-12-02 | Discharge: 2021-12-02 | Disposition: A | Discharge status: Home / Self Care | Payer: 59 | Source: Ambulatory Visit | Attending: *Deleted | Admitting: *Deleted

## 2021-12-02 DIAGNOSIS — N63 Unspecified lump in unspecified breast: Secondary | ICD-10-CM

## 2021-12-26 ENCOUNTER — Other Ambulatory Visit: Payer: Self-pay | Admitting: Nurse Practitioner

## 2022-02-02 ENCOUNTER — Ambulatory Visit: Payer: 59 | Admitting: Nurse Practitioner

## 2022-02-02 ENCOUNTER — Encounter: Payer: Self-pay | Admitting: Nurse Practitioner

## 2022-02-02 VITALS — BP 118/62 | HR 112 | Ht 68.0 in | Wt 118.2 lb

## 2022-02-02 DIAGNOSIS — R110 Nausea: Secondary | ICD-10-CM | POA: Diagnosis not present

## 2022-02-02 DIAGNOSIS — R1010 Upper abdominal pain, unspecified: Secondary | ICD-10-CM | POA: Diagnosis not present

## 2022-02-02 DIAGNOSIS — K5900 Constipation, unspecified: Secondary | ICD-10-CM

## 2022-02-02 MED ORDER — LINACLOTIDE 72 MCG PO CAPS: 72.0000 ug | ORAL_CAPSULE | Freq: Every day | ORAL | 3 refills | Status: DC

## 2022-02-02 MED ORDER — HYOSCYAMINE SULFATE 0.125 MG SL SUBL: 0.1250 mg | SUBLINGUAL_TABLET | Freq: Four times a day (QID) | SUBLINGUAL | 0 refills | Status: DC | PRN

## 2022-02-02 MED ORDER — LINACLOTIDE 72 MCG PO CAPS: 72.0000 ug | ORAL_CAPSULE | Freq: Every day | ORAL | 0 refills | Status: DC

## 2022-02-02 NOTE — Patient Instructions (Addendum)
We have sent the following medications to your pharmacy for you to pick up at your convenience: Levsin- 1 tablet every 6-8 hours as needed for abdominal pain  Linzess 72 mcg- take 1 by mouth 30 minutes before breakfast  Go to our lab the same day of next episode of abdominal pain to have blood drawn.  Due to recent changes in healthcare laws, you may see the results of your imaging and laboratory studies on MyChart before your provider has had a chance to review them.  We understand that in some cases there may be results that are confusing or concerning to you. Not all laboratory results come back in the same time frame and the provider may be waiting for multiple results in order to interpret others.  Please give [place] 48 hours in order for your provider to thoroughly review all the results before contacting the office for clarification of your results.    Thank you for trusting me with your gastrointestinal care!   Carl Best, CRNP

## 2022-02-02 NOTE — Progress Notes (Unsigned)
02/03/2022 Meghan Hayes [id number] 1990-08-30   Chief Complaint: Nausea, abdominal pain   History of Present Illness: Meghan Hayes is a 31 year old female with a past medical history of a Hashimoto's thyroiditis, thyroid nodule, GERD, chronic right mid to RLQ pain and a > 1cm gallbladder polyp s/p laparoscopic cholecystectomy 04/2020. Past tonsillectomy and wisdom tooth extraction. Post operatively, she developed recurrent episodic epigastric pain which radiated to her mid back. She was seen by her [employer] officer and an abdominal MRI/MRCP was done 06/29/2021 which was negative for choledocholithiasis and no biliary ductal dilatation.  LFTs and lipase level were normal.  She was seen in office by Dr. Henrene Pastor 07/04/2020 and her abdominal pain was thought to be possibly due to puncture site from recent cholecystectomy.  She was instructed to use NSAIDs as needed and Bentyl/Levsin were discussed but the patient declined due to concerns regarding corn oil allergy.  She has not been seen in our office since then.  She presents today for further evaluation regarding recurrent episodes of epigastric pain which started 3 to 4 days post cholecystectomy.  She is accompanied by her mother.  She describes having stabbing twisting knifelike epigastric pain with associated profuse sweating which lasts for 30 minutes which occurs approximately twice weekly.  She sometimes feels that her heartbeat to the epigastric area.  No specific food triggers.  Her episodes of epigastric pain are pronounced if she takes magnesium, MiraLAX  or Colace for constipation.  No GERD symptoms.  She takes Pantoprazole 40 mg daily.  She has constant nausea without vomiting.  Decreased appetite.  She does not wish to take antiemetics.  Her nausea is worse if she eats sugar products.  She continues to have chronic RUQ pain which has not improved after she had her gallbladder removed.  She underwent an EGD and colonoscopy 12/02/2018 which were normal.  No diarrhea.  She has chronic constipation and passes pellet-like stool once weekly.  She infrequently sees a few spots of blood on the toilet tissue when constipated.  No black stools.  She takes Ibuprofen 200 mg 3 tabs every 4 hours for 4 to 5 days monthly during her menstrual cycle.  She is knocked over by a dog which resulted in a left leg fracture 08/2021 for which she currently utilizes a brace and continues with physical therapy.  She has lost weight, she is unsure how much weight she has lost if she does not weigh herself at home.  She has lost about 11 pounds since 03/2021 per weights in epic.   No family history of IBD.  Maternal grandmother with history of colon cancer. Father and mother with history of colon polyps.  Labs 02/27/2021: Total bili 0.37.  Alk phos 71.  AST 15.  ALT 10.  WBC 6.0.  Hemoglobin 13.0.  Platelet 428.     Latest Ref Rng & Units 01/29/2021   12:00 AM 04/18/2020    4:12 PM 03/29/2020    9:38 AM  CBC  WBC 3.8 - 10.8 Thousand/uL 4.6  8.5  4.9   Hemoglobin 11.7 - 15.5 g/dL 12.4  12.0  12.8   Hematocrit 35.0 - 45.0 % 38.3  35.7  38.5   Platelets 140 - 400 Thousand/uL 409  330.0  411.0        Latest Ref Rng & Units 01/29/2021   12:00 AM 04/18/2020    4:12 PM 03/29/2020    9:38 AM  CMP  Glucose 65 - 99 mg/dL 72  72  57   BUN 7 - 25 mg/dL _0 Creatinine 0.50 - 0.96 mg/dL 0.63  0.74  0.65   Sodium 135 - 146 mmol/L 140  139  139   Potassium 3.5 - 5.3 mmol/L 4.5  3.8  4.2   Chloride 98 - 110 mmol/L 105  104  104   CO2 20 - 32 mmol/L _1 Calcium 8.6 - 10.2 mg/dL 10.1  9.5  10.1   Total Protein 6.1 - 8.1 g/dL 7.3  7.1  7.5   Total Bilirubin 0.2 - 1.2 mg/dL 0.5  0.4  0.7   Alkaline Phos 39 - 117 U/L  42  49   AST 10 - 30 U/L _2 ALT 6 - 29 U/L _3 GI PROCEDURES:  EGD 12/02/2018: - The esophagus was normal. - The stomach was normal. - The examined duodenum was normal. - The cardia and gastric fundus were normal on  retroflexion.   Colonoscopy 12/02/2018: - The examined portion of the ileum was normal. - The entire examined colon is normal on direct and retroflexion views. - No specimens collected.   IMAGE STUDIES:  Abdominal sonogram 01/18/2015: 6 mm non mobile, non shadowing echogenic focus noted. This could represent a polyp or tumefactive adherent sludge. No biliary distention . Exam otherwise negative  Abdominal MRI/MRCP without contrast 06/29/2020:  Clear lung bases. Normal heart size. No inferior pericardial effusion.   Smooth liver surface contour. No findings to suggest significant fat or  iron deposition within the liver. Within the limitations of a noncontrast  study, no focal hepatic lesions.   Status post cholecystectomy. No frank biliary dilatation. No  choledocholithiasis. Normal spleen size. The pancreas demonstrates normal  signal intensity on T1 and T2-weighted images. Conventional pancreatic  ductal anatomy. No pancreatic ductal dilatation. No adrenal nodules. The  kidneys are symmetric in size without hydronephrosis.   The visualized bowel is nondilated. Normal stomach. No abdominal free  fluid. No suspicious lymphadenopathy. Nondilated abdominal aorta. The  visualized bones are within normal limits.   Impression:   No findings to explain an etiology for pain. No choledocholithiasis.    Current Outpatient Medications on File Prior to Visit  Medication Sig Dispense Refill   diphenhydrAMINE (BENADRYL) 25 mg capsule Take 25 mg by mouth as needed for allergies.     EPINEPHrine 0.3 mg/0.3 mL IJ SOAJ injection INJECT 0.3 MLS (0.3 MG TOTAL) INTO THE MUSCLE ONCE AS NEEDED FOR ANAPHYLAXIS. 1 each 1   NP THYROID 30 MG tablet 1 tab daily x6 days a week, 1.5 tabs 1 day a week. (Patient taking differently: 1 tab daily) 92 tablet 3   pantoprazole (PROTONIX) 40 MG tablet TAKE 1 TABLET BY MOUTH TWICE A DAY 180 tablet 0   VITAMIN D PO Take 5,000 Units by mouth daily.     No current  facility-administered medications on file prior to visit.   Allergies  Allergen Reactions   Banana Anaphylaxis   Cefdinir Anaphylaxis, Rash and Swelling    throat   Corn Oil Anaphylaxis   Eggs Or Egg-Derived Products Anaphylaxis and Swelling    abd swelling   Gluten Meal Anaphylaxis   Other Anaphylaxis and Swelling    All dairy products make intestines swell abd swelling All nuts ALL nuts   Soy Allergy Anaphylaxis and Swelling   Tilactase Swelling  All dairy products make intestines swell All dairy products make intestines swell All dairy products make intestines swell   Tinidazole Anaphylaxis and Rash   Wheat Bran Anaphylaxis   Current Medications, Allergies, Past Medical History, Past Surgical History, Family History and Social History were reviewed in [employer] record.  Review of Systems:   Constitutional: See HPI.  Respiratory: Negative for shortness of breath.   Cardiovascular: Negative for chest pain, palpitations and leg swelling.  Gastrointestinal: See HPI.  Musculoskeletal: Negative for back pain or muscle aches.  Neurological: Negative for dizziness, headaches or paresthesias.   Physical Exam: BP 118/62 (BP Location: Left Arm, Patient Position: Sitting, Cuff Size: Normal)   Pulse (!) 112   Ht 5' 8" (1.727 m)   Wt 118 lb 3.2 oz (53.6 kg) Comment: with brace  SpO2 98%   BMI 17.97 kg/m   Wt Readings from Last 3 Encounters:  02/02/22 118 lb 3.2 oz (53.6 kg)  04/03/21 129 lb (58.5 kg)  03/03/21 120 lb (54.4 kg)    General: 31 year old female in no acute distress. Head: Normocephalic and atraumatic. Eyes: No scleral icterus. Conjunctiva pink . Ears: Normal auditory acuity. Mouth: Dentition intact. No ulcers or lesions.  Lungs: Clear throughout to auscultation. Heart: Regular rate and rhythm, no murmur. Abdomen: Soft, nontender and nondistended. No masses or hepatomegaly. Normal bowel sounds x 4 quadrants.  No bruits. Rectal:  Deferred. Musculoskeletal: Symmetrical with no gross deformities. Extremities: LLE in brace.  Neurological: Alert oriented x 4. No focal deficits.  Psychological: Alert and cooperative. Normal mood and affect  Assessment and Recommendations:  94) 31 year old female with episodic epigastric pain with associated sweats which lasts approximately 30 minutes 2 to 3 days weekly which started 3 - 4  days post cholecystectomy 04/2020.  Normal abdominal MRI/MRCP, no choledocholithiasis or biliary ductal dilatation 06/2020.  Normal LFTs and lipase levels. -Consider sphincter of Oddi dysfunction evaluation/manometry at [facility], to discuss further with Dr. Henrene Pastor -Check CBC, CMP and lipase level at time of next attack of epigastric pain, lab order entered -Hyoscyamine 0.125 mg 1 tab sublingual every 6-8 hours as needed, take onset of epigastric pain.  Note, patient to review with pharmacist if Hyoscyamine contraindicated with history of corn oil allergy prior to taking.   2) Chronic RUQ pain. Normal EGD 11/2018. -See plan in # 1 -Continue Pantoprazole 40 mg daily for now  3) Chronic constipation.  Magnesium supplement, MiraLAX and Colace worsen episodes of epigastric pain. Normal colonoscopy 11/2018. -Trial with Linzess 72 mcg 1 p.o. daily to be taken 30 minutes before breakfast

## 2022-02-03 NOTE — Progress Notes (Signed)
Not sure why she is having pain This is NOT sphincter of Oddi dysfunction. Agree with antispasmodic therapy

## 2022-03-23 ENCOUNTER — Encounter: Payer: Self-pay | Admitting: Family Medicine

## 2022-03-23 ENCOUNTER — Other Ambulatory Visit: Payer: Self-pay | Admitting: Nurse Practitioner

## 2022-03-23 ENCOUNTER — Ambulatory Visit: Payer: 59 | Admitting: Family Medicine

## 2022-03-23 VITALS — BP 117/77 | HR 113 | Temp 98.1°F | Ht 68.0 in | Wt 118.0 lb

## 2022-03-23 DIAGNOSIS — S01339A Puncture wound without foreign body of unspecified ear, initial encounter: Secondary | ICD-10-CM

## 2022-03-23 DIAGNOSIS — L910 Hypertrophic scar: Secondary | ICD-10-CM | POA: Diagnosis not present

## 2022-03-23 HISTORY — DX: Puncture wound without foreign body of unspecified ear, initial encounter: S01.339A

## 2022-03-23 MED ORDER — MUPIROCIN 2 % EX OINT: 1.0000 | TOPICAL_OINTMENT | Freq: Two times a day (BID) | CUTANEOUS | 0 refills | Status: AC

## 2022-03-23 MED ORDER — TRIAMCINOLONE ACETONIDE 0.1 % EX CREA: 1.0000 | TOPICAL_CREAM | Freq: Two times a day (BID) | CUTANEOUS | 0 refills | Status: DC

## 2022-03-23 NOTE — Progress Notes (Signed)
Meghan Hayes , Dec 11, 1990, 32 y.o., female MRN: [id number] Patient Care Team    Relationship Specialty Notifications Start End  Ma Hillock, DO PCP - General Family Medicine  04/14/16   Philemon Kingdom, MD Consulting Physician Internal Medicine  04/14/16    Comment: endocrine  Armbruster, Carlota Raspberry, MD Consulting Physician Gastroenterology  04/14/16   Aloha Gell, MD Consulting Physician Obstetrics and Gynecology  04/14/16     Chief Complaint  Patient presents with   Cyst    Pt reports cyst on R ear but has worsen in the last 2 mos; pt states has drainage in the last 2 weeks     Subjective: Pt presents for an OV with complaints of ear keloid.  She states she forms keloids easily.  She has multiple piercings and she has to be cautious with cleaning products she uses, who size and shape of the piercings etc.  She has done very well with her current piercings and has not had much of an issue lately.  However couple months ago she noticed she started forming a keloid on one of her piercings in her right ear and the back of the ear.  She then noticed that started to become tender and then she manipulated her ear in order to see the back, it expelled a fair amount of drainage.  Since that time she has taken out the piercing and has been cleaning routinely but has not noticed any decrease in size.     03/23/2022    4:11 PM  Depression screen PHQ 2/9  Decreased Interest 0  Down, Depressed, Hopeless 0  PHQ - 2 Score 0    Allergies  Allergen Reactions   Banana Anaphylaxis   Cefdinir Anaphylaxis, Rash and Swelling    throat   Corn Oil Anaphylaxis   Eggs Or Egg-Derived Products Anaphylaxis and Swelling    abd swelling   Gluten Meal Anaphylaxis   Other Anaphylaxis and Swelling    All dairy products make intestines swell abd swelling All nuts ALL nuts   Soy Allergy Anaphylaxis and Swelling   Tilactase Swelling    All dairy products make intestines swell All dairy products  make intestines swell All dairy products make intestines swell   Tinidazole Anaphylaxis and Rash   Wheat Bran Anaphylaxis   Social History   Social History Narrative   Pt is single. Just finished her BA degree ans searching for employment.    Takes a daily vitamin.    Wears her seatbelt and bicycle helmet.    Smoke detector in the home.    Feels safe in her relationships.    Past Medical History:  Diagnosis Date   Allergy    seasonal, nuts, wheat, corn, soy   Arthritis 03/2021   Gallbladder polyp 2011   6 mm has had repeat imaging and stable.    GERD (gastroesophageal reflux disease)    Hashimoto's disease 2014   Ileocecal valve syndrome 2017   followed by dr. Havery Moros   Migraines    Thyroid nodule 2014   last [place] 2016: 1.2cm thyroid nodule vs PTH adenoma.    Past Surgical History:  Procedure Laterality Date   CHOLECYSTECTOMY     TONSILLECTOMY  2008   WISDOM TOOTH EXTRACTION  23   has panic attack and crying after procedure   Family History  Problem Relation Age of Onset   Colon polyps Mother    Hypertension Father    Allergies Father  Hyperlipidemia Father    Colon polyps Father    Hypertension Paternal Uncle    Hypertension Maternal Grandmother    Colon cancer Maternal Grandmother 63       colon   Esophageal cancer Neg Hx    Rectal cancer Neg Hx    Stomach cancer Neg Hx    Allergies as of 03/23/2022       Reactions   Banana Anaphylaxis   Cefdinir Anaphylaxis, Rash, Swelling   throat   Corn Oil Anaphylaxis   Eggs Or Egg-derived Products Anaphylaxis, Swelling   abd swelling   Gluten Meal Anaphylaxis   Other Anaphylaxis, Swelling   All dairy products make intestines swell abd swelling All nuts ALL nuts   Soy Allergy Anaphylaxis, Swelling   Tilactase Swelling   All dairy products make intestines swell All dairy products make intestines swell All dairy products make intestines swell   Tinidazole Anaphylaxis, Rash   Wheat Bran Anaphylaxis         Medication List        Accurate as of March 23, 2022  5:01 PM. If you have any questions, ask your nurse or doctor.          diphenhydrAMINE 25 mg capsule Commonly known as: BENADRYL Take 25 mg by mouth as needed for allergies.   EPINEPHrine 0.3 mg/0.3 mL Soaj injection Commonly known as: EPI-PEN INJECT 0.3 MLS (0.3 MG TOTAL) INTO THE MUSCLE ONCE AS NEEDED FOR ANAPHYLAXIS.   hyoscyamine 0.125 MG SL tablet Commonly known as: LEVSIN SL Place 1 tablet (0.125 mg total) under the tongue every 6 (six) hours as needed (abdominal pain).   linaclotide 72 MCG capsule Commonly known as: Linzess Take 1 capsule (72 mcg total) by mouth daily before breakfast. Take 30 minutes before breakfast.   mupirocin ointment 2 % Commonly known as: BACTROBAN Apply 1 Application topically 2 (two) times daily for 14 days. Started by: Howard Pouch, DO   NP Thyroid 30 MG tablet Generic drug: thyroid 1 tab daily x6 days a week, 1.5 tabs 1 day a week. What changed: additional instructions   pantoprazole 40 MG tablet Commonly known as: PROTONIX TAKE 1 TABLET BY MOUTH TWICE A DAY   triamcinolone cream 0.1 % Commonly known as: KENALOG Apply 1 Application topically 2 (two) times daily. Started by: Howard Pouch, DO   VITAMIN D PO Take 5,000 Units by mouth daily.        All past medical history, surgical history, allergies, family history, immunizations andmedications were updated in the EMR today and reviewed under the history and medication portions of their EMR.     ROS Negative, with the exception of above mentioned in HPI   Objective:  BP 117/77   Pulse (!) 113   Temp 98.1 F (36.7 C) (Oral)   Ht '5\' 8"'$  (1.727 m)   Wt 118 lb (53.5 kg)   SpO2 98%   BMI 17.94 kg/m  Body mass index is 17.94 kg/m. Physical Exam Vitals and nursing note reviewed.  Constitutional:      General: She is not in acute distress.    Appearance: Normal appearance. She is normal weight. She is not  ill-appearing or toxic-appearing.  HENT:     Head: Normocephalic and atraumatic.  Eyes:     General: No scleral icterus.       Right eye: No discharge.        Left eye: No discharge.     Extraocular Movements: Extraocular movements intact.  Conjunctiva/sclera: Conjunctivae normal.     Pupils: Pupils are equal, round, and reactive to light.  Skin:    Findings: No rash.     Comments: Pea-sized keloid formation right posterior ear piercing.  Scaly dry skin present.  No drainage present.  Neurological:     Mental Status: She is alert and oriented to person, place, and time. Mental status is at baseline.     Motor: No weakness.     Coordination: Coordination normal.     Gait: Gait normal.  Psychiatric:        Mood and Affect: Mood normal.        Behavior: Behavior normal.        Thought Content: Thought content normal.        Judgment: Judgment normal.     No results found. No results found. No results found for this or any previous visit (from the past 24 hour(s)).  Assessment/Plan: Meghan Hayes is a 32 y.o. female present for OV for  Complication of ear piercing, initial encounter/keloid Keloid formation right posterior ear piercing.  no drainage. Will first treat with Bactroban twice daily x 7 days, then start Kenalog cream twice daily x 14 days. Patient will call in in 3-4 weeks if desiring referral to plastics for keloid removal/management.  Reviewed expectations re: course of current medical issues. Discussed self-management of symptoms. Outlined signs and symptoms indicating need for more acute intervention. Patient verbalized understanding and all questions were answered. Patient received an After-Visit Summary.    No orders of the defined types were placed in this encounter.  Meds ordered this encounter  Medications   mupirocin ointment (BACTROBAN) 2 %    Sig: Apply 1 Application topically 2 (two) times daily for 14 days.    Dispense:  15 g    Refill:  0    triamcinolone cream (KENALOG) 0.1 %    Sig: Apply 1 Application topically 2 (two) times daily.    Dispense:  30 g    Refill:  0   Referral Orders  No referral(s) requested today     Note is dictated utilizing voice recognition software. Although note has been proof read prior to signing, occasional typographical errors still can be missed. If any questions arise, please do not hesitate to call for verification.   electronically signed by:  Howard Pouch, DO  [place]

## 2022-03-23 NOTE — Patient Instructions (Signed)
Return in about 2 weeks (around 04/06/2022), or if symptoms worsen or fail to improve.        Great to see you today.  I have refilled the medication(s) we provide.   If labs were collected, we will inform you of lab results once received either by echart message or telephone call.   - echart message- for normal results that have been seen by the patient already.   - telephone call: abnormal results or if patient has not viewed results in their echart.

## 2022-05-29 ENCOUNTER — Ambulatory Visit
Admission: RE | Admit: 2022-05-29 | Discharge: 2022-05-29 | Disposition: A | Discharge status: Home / Self Care | Payer: 59 | Source: Ambulatory Visit | Attending: Family Medicine | Admitting: Family Medicine

## 2022-05-29 ENCOUNTER — Ambulatory Visit (INDEPENDENT_AMBULATORY_CARE_PROVIDER_SITE_OTHER): Payer: 59

## 2022-05-29 VITALS — BP 119/80 | HR 99 | Temp 98.0°F | Resp 14

## 2022-05-29 DIAGNOSIS — R1031 Right lower quadrant pain: Secondary | ICD-10-CM

## 2022-05-29 DIAGNOSIS — R0789 Other chest pain: Secondary | ICD-10-CM | POA: Diagnosis not present

## 2022-05-29 DIAGNOSIS — R1011 Right upper quadrant pain: Secondary | ICD-10-CM

## 2022-05-29 DIAGNOSIS — K5909 Other constipation: Secondary | ICD-10-CM

## 2022-05-29 NOTE — Discharge Instructions (Addendum)
Advised patient of chest x-ray results and KUB results with hardcopy and images provided.  Advised patient to increase daily fiber intake to 80 mg/day, encouraged increase daily water intake to 64 ounces per day.  Advised may use OTC Benefiber or Activia daily bowel movements on a daily basis.  Advised if symptoms worsen and/or unresolved please follow-up with PCP or GI for further evaluation.

## 2022-05-29 NOTE — ED Provider Notes (Signed)
[facility]    CSN: [id number] Arrival date & time: 05/29/22  1718      History   Chief Complaint Chief Complaint  Patient presents with   Abdominal Pain    Entered by patient   Chest Pain    rib    HPI Meghan Hayes is a 32 y.o. female.   HPI 32 year old female presents with right-sided abdominal and rib pain for 6 days.  PMH significant for periumbilical abdominal pain, polyarthralgia, and Hashimoto's thyroiditis.  Patient is accompanied by her Mother this evening.  Past Medical History:  Diagnosis Date   Allergy    seasonal, nuts, wheat, corn, soy   Arthritis 03/2021   Gallbladder polyp 2011   6 mm has had repeat imaging and stable.    GERD (gastroesophageal reflux disease)    Hashimoto's disease 2014   Ileocecal valve syndrome 2017   followed by dr. Adela LankArmbruster   Migraines    Thyroid nodule 2014   last US 2016: 1.2cm thyroid nodule vs PTH adenoma.     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   LAD (lymphadenopathy) of right cervical region 01/18/2018   Pain of right mastoid 01/18/2018   Right lumbar radiculitis 07/20/2017   Patellofemoral syndrome, left 07/06/2017   Tibialis posterior tendinitis 08/10/2016   Vitamin D deficiency 04/14/2016   Throat pain 04/14/2016   Thyroid nodule 04/14/2016   GERD (gastroesophageal reflux disease)    Periumbilical abdominal pain 08/20/2014   Hashimoto's thyroiditis 07/26/2012   Dysphagia 03/24/2007   Gastro-esophageal reflux disease with esophagitis 02/25/2007    Past Surgical History:  Procedure Laterality Date   CHOLECYSTECTOMY     TONSILLECTOMY  2008   WISDOM TOOTH EXTRACTION   23   has panic attack and crying after procedure    OB History     Gravida  0   Para      Term      Preterm      AB      Living         SAB      IAB      Ectopic      Multiple      Live Births               Home Medications    Prior to Admission medications   Medication Sig Start Date End Date Taking? Authorizing Provider  diphenhydrAMINE (BENADRYL) 25 mg capsule Take 25 mg by mouth as needed for allergies.    [provider]  EPINEPHrine 0.3 mg/0.3 mL IJ SOAJ injection INJECT 0.3 MLS (0.3 MG TOTAL) INTO THE MUSCLE ONCE AS NEEDED FOR ANAPHYLAXIS. 01/31/19   Kuneff, Renee A, DO  hyoscyamine (LEVSIN SL) 0.125 MG SL tablet Place 1 tablet (0.125 mg total) under the tongue every 6 (six) hours as needed (abdominal pain). Patient not taking: Reported on 03/23/2022 02/02/22   Arnaldo NatalKennedy-Smith, Colleen M, NP  linaclotide [facility](LINZESS) 72 MCG capsule Take 1 capsule (72 mcg total) by mouth daily before breakfast. Take 30 minutes before breakfast. Patient not taking: Reported on 03/23/2022 02/02/22 01/28/23  Arnaldo NatalKennedy-Smith, Colleen M, NP  NP THYROID 30 MG tablet 1 tab daily x6 days a week,  1.5 tabs 1 day a week. Patient taking differently: 1 tab daily 02/06/19   Kuneff, Renee A, DO  pantoprazole (PROTONIX) 40 MG tablet TAKE 1 TABLET BY MOUTH TWICE A DAY 03/23/22   Arnaldo Natal, NP  triamcinolone cream (KENALOG) 0.1 % Apply 1 Application topically 2 (two) times daily. Patient not taking: Reported on 05/29/2022 03/23/22   Felix Pacini A, DO  VITAMIN D PO Take 5,000 Units by mouth daily. Patient not taking: Reported on 05/29/2022    [provider]    Family History Family History  Problem Relation Age of Onset   Colon polyps Mother    Hypertension Father    Allergies Father    Hyperlipidemia Father    Colon polyps Father    Hypertension Paternal Uncle    Hypertension Maternal Grandmother    Colon cancer Maternal Grandmother 48       colon   Esophageal  cancer Neg Hx    Rectal cancer Neg Hx    Stomach cancer Neg Hx     Social History Social History   Tobacco Use   Smoking status: Never    Passive exposure: Past   Smokeless tobacco: Never  Vaping Use   Vaping Use: Never used  Substance Use Topics   Alcohol use: No   Drug use: No     Allergies   Banana, Cefdinir, Corn oil, Egg-derived products, Gluten meal, Other, Soy allergy, Tilactase, Tinidazole, and Wheat   Review of Systems Review of Systems  Cardiovascular:  Positive for chest pain.  Gastrointestinal:  Positive for abdominal pain.  All other systems reviewed and are negative.    Physical Exam Triage Vital Signs ED Triage Vitals [05/29/22 1727]  Enc Vitals Group     BP 119/80     Pulse Rate 99     Resp 14     Temp 98 F (36.7 C)     Temp Source Oral     SpO2 98 %     Weight      Height      Head Circumference      Peak Flow      Pain Score 6     Pain Loc      Pain Edu?      Excl. in GC?    No data found.  Updated Vital Signs BP 119/80 (BP Location: Right Arm)   Pulse 99   Temp 98 F (36.7 C) (Oral)   Resp 14   SpO2 98%       Physical Exam Vitals and nursing note reviewed.  Constitutional:      General: She is not in acute distress.    Appearance: She is well-developed and normal weight. She is not ill-appearing.  HENT:     Head: Normocephalic and atraumatic.  Eyes:     Extraocular Movements: Extraocular movements intact.     Pupils: Pupils are equal, round, and reactive to light.  Cardiovascular:     Rate and Rhythm: Normal rate and regular rhythm.     Heart sounds: Normal heart sounds. No murmur heard. Pulmonary:     Effort: Pulmonary effort is normal.     Breath sounds: Normal breath sounds. No wheezing, rhonchi or rales.  Abdominal:     General: Abdomen is flat. Bowel sounds are normal. There is no distension or abdominal bruit.     Palpations: There is no shifting dullness, fluid wave, hepatomegaly, splenomegaly, mass or  pulsatile mass.     Tenderness: There is  abdominal tenderness in the right lower quadrant. There is no right CVA tenderness, left CVA tenderness, guarding or rebound. Negative signs include Murphy's sign, Rovsing's sign and McBurney's sign.     Hernia: No hernia is present.  Skin:    General: Skin is warm and dry.  Neurological:     General: No focal deficit present.     Mental Status: She is alert and oriented to person, place, and time.  Psychiatric:        Mood and Affect: Mood normal.        Behavior: Behavior normal.      UC Treatments / Results  Labs (all labs ordered are listed, but only abnormal results are displayed) Labs Reviewed - No data to display  EKG   Radiology DG Abdomen 1 View  Result Date: 05/29/2022 CLINICAL DATA:  Right upper quadrant pain EXAM: ABDOMEN - 1 VIEW COMPARISON:  None Available. FINDINGS: The bowel gas pattern is normal. Cholecystectomy clips. Moderate volume stool throughout the colon. No radio-opaque calculi or other significant radiographic abnormality are seen. Osseous structures appear unremarkable. IMPRESSION: Nonobstructive bowel gas pattern. Moderate volume stool throughout the colon. Electronically Signed   By: Duanne GuessNicholas  Plundo D.O.   On: 05/29/2022 18:44   DG Chest 2 View  Result Date: 05/29/2022 CLINICAL DATA:  Right-sided chest wall pain x 5 days EXAM: CHEST - 2 VIEW COMPARISON:  None Available. FINDINGS: The heart size and mediastinal contours are within normal limits. Both lungs are clear. The visualized skeletal structures are unremarkable. IMPRESSION: Normal chest radiographs. Electronically Signed   By: Duanne GuessNicholas  Plundo D.O.   On: 05/29/2022 18:43    Procedures Procedures (including critical care time)  Medications Ordered in UC Medications - No data to display  Initial Impression / Assessment and Plan / UC Course  I have reviewed the triage vital signs and the nursing notes.  Pertinent labs & imaging results that were available  during my care of the patient were reviewed by me and considered in my medical decision making (see chart for details).     MDM: 1.  Chest wall pain-CXR revealed above; 2.  Right lower quadrant abdominal pain-KUB revealed above, moderate stool burden, noted; 3.  Other constipation. Advised patient of chest x-ray results and KUB results with hardcopy and images provided.  Advised patient to increase daily fiber intake to 80 mg/day, encouraged increase daily water intake to 64 ounces per day.  Advised may use OTC Benefiber or Activia daily bowel movements on a daily basis.  Advised if symptoms worsen and/or unresolved please follow-up with PCP or GI for further evaluation.   Final diagnoses:  Chest wall pain  Right lower quadrant abdominal pain  Other constipation     Discharge Instructions      Advised patient of chest x-ray results and KUB results with hardcopy and images provided.  Advised patient to increase daily fiber intake to 80 mg/day, encouraged increase daily water intake to 64 ounces per day.  Advised may use OTC Benefiber or Activia daily bowel movements on a daily basis.  Advised if symptoms worsen and/or unresolved please follow-up with PCP or GI for further evaluation.     ED Prescriptions   None    PDMP not reviewed this encounter.   Trevor IhaRagan, Etienne Mowers, FNP 05/29/22 1905

## 2022-05-29 NOTE — ED Triage Notes (Signed)
Pt presents with worsening rt sided abdominal and rib pain that began last Sunday.

## 2022-09-16 ENCOUNTER — Other Ambulatory Visit: Payer: Self-pay | Admitting: Nurse Practitioner

## 2022-12-28 ENCOUNTER — Other Ambulatory Visit: Payer: Self-pay | Admitting: *Deleted

## 2022-12-28 DIAGNOSIS — E063 Autoimmune thyroiditis: Secondary | ICD-10-CM

## 2022-12-28 DIAGNOSIS — E038 Other specified hypothyroidism: Secondary | ICD-10-CM

## 2023-01-07 ENCOUNTER — Encounter: Payer: Self-pay | Admitting: Family Medicine

## 2023-01-07 ENCOUNTER — Ambulatory Visit: Payer: 59 | Admitting: Family Medicine

## 2023-01-07 VITALS — BP 102/64 | HR 99 | Temp 97.8°F | Wt 113.6 lb

## 2023-01-07 DIAGNOSIS — H66002 Acute suppurative otitis media without spontaneous rupture of ear drum, left ear: Secondary | ICD-10-CM

## 2023-01-07 DIAGNOSIS — H9209 Otalgia, unspecified ear: Secondary | ICD-10-CM | POA: Diagnosis not present

## 2023-01-07 LAB — POC COVID19 BINAXNOW: SARS Coronavirus 2 Ag: NEGATIVE

## 2023-01-07 MED ORDER — FLUTICASONE PROPIONATE 50 MCG/ACT NA SUSP: 2.0000 | Freq: Two times a day (BID) | NASAL | 0 refills | Status: DC

## 2023-01-07 MED ORDER — AMOXICILLIN 875 MG PO TABS: 875.0000 mg | ORAL_TABLET | Freq: Two times a day (BID) | ORAL | 0 refills | Status: DC

## 2023-01-07 NOTE — Progress Notes (Signed)
Meghan Hayes , 1990/06/05, 32 y.o., female MRN: [id number] Patient Care Team    Relationship Specialty Notifications Start End  Natalia Leatherwood, DO PCP - General Family Medicine  04/14/16   Carlus Pavlov, MD Consulting Physician Internal Medicine  04/14/16    Comment: endocrine  Armbruster, Willaim Rayas, MD Consulting Physician Gastroenterology  04/14/16   Noland Fordyce, MD Consulting Physician Obstetrics and Gynecology  04/14/16     Chief Complaint  Patient presents with   Ear Pain    Onset Tuesday; HA with ear pain     Subjective: Meghan Hayes is a 32 y.o. Pt presents for an OV with complaints of left ear pain, headache  of 2 days duration.   Ear hurts to touch, even when washing.  Pt has tried ibf to ease their symptoms.      03/23/2022    4:11 PM  Depression screen PHQ 2/9  Decreased Interest 0  Down, Depressed, Hopeless 0  PHQ - 2 Score 0    Allergies  Allergen Reactions   Banana Anaphylaxis   Cefdinir Anaphylaxis, Rash and Swelling    throat   Corn Oil Anaphylaxis   Egg-Derived Products Anaphylaxis and Swelling    abd swelling   Gluten Meal Anaphylaxis   Other Anaphylaxis and Swelling    All dairy products make intestines swell abd swelling All nuts ALL nuts   Soy Allergy Anaphylaxis and Swelling   Tilactase Swelling    All dairy products make intestines swell All dairy products make intestines swell All dairy products make intestines swell   Tinidazole Anaphylaxis and Rash   Wheat Anaphylaxis   Social History   Social History Narrative   Pt is single. Just finished her BA degree ans searching for employment.    Takes a daily vitamin.    Wears her seatbelt and bicycle helmet.    Smoke detector in the home.    Feels safe in her relationships.    Past Medical History:  Diagnosis Date   Allergy    seasonal, nuts, wheat, corn, soy   Arthritis 03/2021   Complication of ear piercing, initial encounter 03/23/2022   COVID-19 03/03/2021    Gallbladder polyp 2011   6 mm has had repeat imaging and stable.    GERD (gastroesophageal reflux disease)    Hashimoto's disease 2014   Ileocecal valve syndrome 2017   followed by dr. Adela Lank   Migraines    Thyroid nodule 2014   last [place] 2016: 1.2cm thyroid nodule vs PTH adenoma.    Past Surgical History:  Procedure Laterality Date   CHOLECYSTECTOMY     TONSILLECTOMY  2008   WISDOM TOOTH EXTRACTION  23   has panic attack and crying after procedure   Family History  Problem Relation Age of Onset   Colon polyps Mother    Hypertension Father    Allergies Father    Hyperlipidemia Father    Colon polyps Father    Hypertension Paternal Uncle    Hypertension Maternal Grandmother    Colon cancer Maternal Grandmother 61       colon   Esophageal cancer Neg Hx    Rectal cancer Neg Hx    Stomach cancer Neg Hx    Allergies as of 01/07/2023       Reactions   Banana Anaphylaxis   Cefdinir Anaphylaxis, Rash, Swelling   throat   Corn Oil Anaphylaxis   Egg-derived Products Anaphylaxis, Swelling   abd swelling   Gluten Meal  Anaphylaxis   Other Anaphylaxis, Swelling   All dairy products make intestines swell abd swelling All nuts ALL nuts   Soy Allergy Anaphylaxis, Swelling   Tilactase Swelling   All dairy products make intestines swell All dairy products make intestines swell All dairy products make intestines swell   Tinidazole Anaphylaxis, Rash   Wheat Anaphylaxis        Medication List        Accurate as of January 07, 2023 11:19 AM. If you have any questions, ask your nurse or doctor.          STOP taking these medications    hyoscyamine 0.125 MG SL tablet Commonly known as: LEVSIN SL Stopped by: Felix Pacini   linaclotide 72 MCG capsule Commonly known as: Linzess Stopped by: Felix Pacini   triamcinolone cream 0.1 % Commonly known as: KENALOG Stopped by: Felix Pacini   VITAMIN D PO Stopped by: Felix Pacini       TAKE these medications     amoxicillin 875 MG tablet Commonly known as: AMOXIL Take 1 tablet (875 mg total) by mouth 2 (two) times daily for 10 days. Started by: Felix Pacini   diphenhydrAMINE 25 mg capsule Commonly known as: BENADRYL Take 25 mg by mouth as needed for allergies.   EPINEPHrine 0.3 mg/0.3 mL Soaj injection Commonly known as: EPI-PEN INJECT 0.3 MLS (0.3 MG TOTAL) INTO THE MUSCLE ONCE AS NEEDED FOR ANAPHYLAXIS.   fluticasone 50 MCG/ACT nasal spray Commonly known as: FLONASE Place 2 sprays into both nostrils 2 (two) times daily. Started by: Felix Pacini   NP Thyroid 30 MG tablet Generic drug: thyroid Take by mouth. What changed: Another medication with the same name was removed. Continue taking this medication, and follow the directions you see here. Changed by: Felix Pacini   pantoprazole 40 MG tablet Commonly known as: PROTONIX TAKE 1 TABLET BY MOUTH TWICE A DAY        All past medical history, surgical history, allergies, family history, immunizations andmedications were updated in the EMR today and reviewed under the history and medication portions of their EMR.     Review of Systems  Constitutional:  Positive for malaise/fatigue. Negative for chills and fever.  HENT:  Positive for ear pain. Negative for congestion, nosebleeds, sinus pain and sore throat.   Eyes:  Negative for pain.  Respiratory:  Negative for cough, sputum production, shortness of breath and wheezing.   Gastrointestinal:  Positive for nausea. Negative for diarrhea and vomiting.  Musculoskeletal:  Negative for myalgias.  Skin:  Negative for rash.  Neurological:  Positive for headaches. Negative for dizziness.   Negative, with the exception of above mentioned in HPI   Objective:  BP 102/64   Pulse 99   Temp 97.8 F (36.6 C)   Wt 113 lb 9.6 oz (51.5 kg)   LMP 12/19/2022   SpO2 98%   BMI 17.27 kg/m  Body mass index is 17.27 kg/m. Physical Exam Vitals and nursing note reviewed.  Constitutional:       General: She is not in acute distress.    Appearance: Normal appearance. She is normal weight. She is not ill-appearing or toxic-appearing.  HENT:     Head: Normocephalic and atraumatic.     Right Ear: Tympanic membrane, ear canal and external ear normal. There is no impacted cerumen.     Left Ear: Ear canal and external ear normal. A middle ear effusion is present. There is no impacted cerumen. No mastoid tenderness. Tympanic membrane  is erythematous and bulging. Tympanic membrane is not perforated.     Nose: No congestion or rhinorrhea.     Mouth/Throat:     Mouth: Mucous membranes are moist.  Eyes:     General: No scleral icterus.       Right eye: No discharge.        Left eye: No discharge.     Extraocular Movements: Extraocular movements intact.     Conjunctiva/sclera: Conjunctivae normal.     Pupils: Pupils are equal, round, and reactive to light.  Cardiovascular:     Rate and Rhythm: Normal rate and regular rhythm.  Pulmonary:     Effort: Pulmonary effort is normal. No respiratory distress.     Breath sounds: Normal breath sounds. No wheezing, rhonchi or rales.  Musculoskeletal:     Cervical back: Neck supple.  Lymphadenopathy:     Cervical: No cervical adenopathy.  Skin:    Findings: No rash.  Neurological:     Mental Status: She is alert and oriented to person, place, and time. Mental status is at baseline.     Motor: No weakness.     Coordination: Coordination normal.     Gait: Gait normal.  Psychiatric:        Mood and Affect: Mood normal.        Behavior: Behavior normal.        Thought Content: Thought content normal.        Judgment: Judgment normal.    No results found. No results found. Results for orders placed or performed in visit on 01/07/23 (from the past 24 hour(s))  POC COVID-19 BinaxNow     Status: None   Collection Time: 01/07/23 11:19 AM  Result Value Ref Range   SARS Coronavirus 2 Ag Negative Negative    Assessment/Plan: Meghan Hayes is a 32  y.o. female present for OV for  Otalgia, unspecified laterality Non-recurrent acute suppurative otitis media of left ear without spontaneous rupture of tympanic membrane Rest, hydrate.  start flonase Amoxil BID prescribed, take until completed.  Covid test negative today F/U 2 weeks if not improved.   Reviewed expectations re: course of current medical issues. Discussed self-management of symptoms. Outlined signs and symptoms indicating need for more acute intervention. Patient verbalized understanding and all questions were answered. Patient received an After-Visit Summary.    Orders Placed This Encounter  Procedures   POC COVID-19 BinaxNow   Meds ordered this encounter  Medications   amoxicillin (AMOXIL) 875 MG tablet    Sig: Take 1 tablet (875 mg total) by mouth 2 (two) times daily for 10 days.    Dispense:  20 tablet    Refill:  0   fluticasone (FLONASE) 50 MCG/ACT nasal spray    Sig: Place 2 sprays into both nostrils 2 (two) times daily.    Dispense:  16 g    Refill:  0   Referral Orders  No referral(s) requested today     Note is dictated utilizing voice recognition software. Although note has been proof read prior to signing, occasional typographical errors still can be missed. If any questions arise, please do not hesitate to call for verification.   electronically signed by:  Felix Pacini, DO   [facility] - [place]

## 2023-01-07 NOTE — Patient Instructions (Addendum)
Return in about 2 weeks (around 01/21/2023), or if symptoms worsen or fail to improve.        Great to see you today.  I have refilled the medication(s) we provide.   If labs were collected or images ordered, we will inform you of  results once we have received them and reviewed. We will contact you either by echart message, or telephone call.  Please give ample time to the testing facility, and our office to run,  receive and review results. Please do not call inquiring of results, even if you can see them in your chart. We will contact you as soon as we are able. If it has been over 1 week since the test was completed, and you have not yet heard from [place], then please call us.    - echart message- for normal results that have been seen by the patient already.   - telephone call: abnormal results or if patient has not viewed results in their echart.  If a referral to a specialist was entered for you, please call us in 2 weeks if you have not heard from the specialist office to schedule.

## 2023-01-08 ENCOUNTER — Telehealth: Payer: Self-pay

## 2023-01-08 MED ORDER — AZITHROMYCIN 200 MG/5ML PO SUSR: ORAL | 0 refills | Status: AC

## 2023-01-08 NOTE — Telephone Encounter (Signed)
Spoke with patient regarding results/recommendations.  

## 2023-01-08 NOTE — Telephone Encounter (Signed)
Pt called and said when she went to pick up her Amoxicillin from the pharmacy she was told to contact her PCP due to possible reaction. She states she completely forgot about it yesterday when it was prescribed. Pt is requesting an alternative to amoxicillin.

## 2023-01-08 NOTE — Telephone Encounter (Signed)
Added amox to her allergy list. The remaining antibiotics available to treat an ear infection, in tablet form, have corn oil, which she has reported having anaphylaxis reaction to corn oil.  Therefore she will need to take azithromycin taper that comes in the solution format.  This does not contain corn oil. Called into the pharmacy in [place] she request

## 2023-01-08 NOTE — Addendum Note (Signed)
Addended by: Felix Pacini A on: 01/08/2023 12:26 PM   Modules accepted: Orders

## 2023-01-14 ENCOUNTER — Other Ambulatory Visit: Payer: Self-pay | Admitting: Family Medicine

## 2023-01-14 NOTE — Telephone Encounter (Signed)
Patient called back with more questions and concerns about prescription that was prescribed last week. Patient is allergic to banana.  Azithromycin has banana flavoring.  Please call tomorrow [phone]

## 2023-01-15 NOTE — Telephone Encounter (Signed)
Patient has called once again to inquire about a different medication being as she has an allergy to bananas.

## 2023-01-15 NOTE — Telephone Encounter (Signed)
She is asking for an alternative medication because she does still need the medication.

## 2023-01-15 NOTE — Telephone Encounter (Signed)
Please advise 

## 2023-01-19 ENCOUNTER — Ambulatory Visit
Admission: RE | Admit: 2023-01-19 | Discharge: 2023-01-19 | Disposition: A | Discharge status: Home / Self Care | Payer: 59 | Source: Ambulatory Visit | Attending: *Deleted | Admitting: *Deleted

## 2023-01-19 DIAGNOSIS — E038 Other specified hypothyroidism: Secondary | ICD-10-CM

## 2023-01-19 DIAGNOSIS — E063 Autoimmune thyroiditis: Secondary | ICD-10-CM

## 2023-01-19 NOTE — Telephone Encounter (Signed)
Left Vm for pt to return my call.

## 2023-01-20 NOTE — Telephone Encounter (Signed)
Mychart message sent.

## 2023-03-18 ENCOUNTER — Other Ambulatory Visit: Payer: Self-pay | Admitting: Nurse Practitioner

## 2023-05-12 DIAGNOSIS — L308 Other specified dermatitis: Secondary | ICD-10-CM | POA: Diagnosis not present

## 2023-05-12 DIAGNOSIS — L2089 Other atopic dermatitis: Secondary | ICD-10-CM | POA: Diagnosis not present

## 2023-05-27 ENCOUNTER — Other Ambulatory Visit: Payer: Self-pay | Admitting: Family Medicine

## 2023-07-27 ENCOUNTER — Other Ambulatory Visit: Payer: Self-pay

## 2023-07-27 ENCOUNTER — Ambulatory Visit
Admission: RE | Admit: 2023-07-27 | Discharge: 2023-07-27 | Disposition: A | Discharge status: Home / Self Care | Source: Ambulatory Visit

## 2023-07-27 VITALS — BP 115/72 | HR 93 | Temp 99.3°F | Resp 16

## 2023-07-27 DIAGNOSIS — H9202 Otalgia, left ear: Secondary | ICD-10-CM

## 2023-07-27 MED ORDER — AZITHROMYCIN 250 MG PO TABS: 250.0000 mg | ORAL_TABLET | Freq: Every day | ORAL | 0 refills | Status: DC

## 2023-07-27 NOTE — ED Triage Notes (Signed)
 Left ear pain since November, has been seen before by physician but never got better. No fever. No otc meds. Was on abx drops previously, last used about 2 weeks ago.

## 2023-07-27 NOTE — ED Provider Notes (Signed)
 [facility]    CSN: [id number] Arrival date & time: 07/27/23  1659      History   Chief Complaint Chief Complaint  Patient presents with   Ear Fullness    HPI Meghan Hayes is a 33 y.o. female.   HPI Patient is a 32 year old female who presents to the urgent care today with concerns of left ear pain.  She reports the pain began back in November.  She had been prescribed some ofloxacin drops at that time which seem to help somewhat but never fully resolved her symptoms.  She also was given some Flonase  but discontinued that.  She reports that it feels like there is a pressure in the ear and fluid in the ear along with the pain.  She reports that the pain has been worsening over the past week.  She tried using the ofloxacin drops with no improvement in her symptoms.  She reports seeing an ENT doctor many years ago but has not seen one recently.  She denies any loss of hearing, fever, swelling behind the ear, neurologic changes, or other concerns at this time. Past Medical History:  Diagnosis Date   Allergy    seasonal, nuts, wheat, corn, soy   Arthritis 03/2021   Complication of ear piercing, initial encounter 03/23/2022   COVID-19 03/03/2021   Gallbladder polyp 2011   6 mm has had repeat imaging and stable.    GERD (gastroesophageal reflux disease)    Hashimoto's disease 2014   Ileocecal valve syndrome 2017   followed by dr. [facility]   Migraines    Thyroid  nodule 2014   last US  2016: 1.2cm thyroid  nodule vs PTH adenoma.     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   Vitamin D  deficiency 04/14/2016   Thyroid  nodule 04/14/2016   GERD (gastroesophageal reflux disease)    Periumbilical abdominal pain 08/20/2014   Hashimoto's thyroiditis 07/26/2012   Dysphagia 03/24/2007   Gastro-esophageal reflux disease with esophagitis 02/25/2007    Past Surgical History:  Procedure Laterality Date   CHOLECYSTECTOMY     TONSILLECTOMY  2008   WISDOM TOOTH EXTRACTION  23   has panic attack and crying after procedure    OB History     Gravida  0   Para      Term      Preterm      AB      Living         SAB      IAB      Ectopic      Multiple      Live Births               Home Medications    Prior to Admission medications   Medication Sig Start Date End Date Taking? Authorizing Provider  azithromycin  (ZITHROMAX ) 250 MG tablet Take 1 tablet (250 mg total) by mouth daily. Take first 2 tablets together, then 1 every day until finished. 07/27/23  Yes Edna Gouty, PA-C  diphenhydrAMINE  (BENADRYL ) 25 mg capsule Take 25 mg by mouth  as needed for allergies.    [provider]  EPINEPHrine  0.3 mg/0.3 mL IJ SOAJ injection INJECT 0.3 MLS (0.3 MG TOTAL) INTO THE MUSCLE ONCE AS NEEDED FOR ANAPHYLAXIS. 01/31/19   Kuneff, Renee A, DO  fluticasone  (FLONASE ) 50 MCG/ACT nasal spray PLACE 2 SPRAYS INTO BOTH NOSTRILS 2 (TWO) TIMES DAILY 05/28/23   Kuneff, Renee A, DO  pantoprazole  (PROTONIX ) 40 MG tablet TAKE 1 TABLET BY MOUTH TWICE A DAY 03/19/23   Kennedy-Smith, Colleen M, NP  thyroid  (NP THYROID ) 30 MG tablet Take by mouth. 04/04/16   [provider]    Family History Family History  Problem Relation Age of Onset   Colon polyps Mother    Hypertension Father    Allergies Father    Hyperlipidemia Father    Colon polyps Father    Hypertension Paternal Uncle    Hypertension Maternal Grandmother    Colon cancer Maternal Grandmother 25       colon    Esophageal cancer Neg Hx    Rectal cancer Neg Hx    Stomach cancer Neg Hx     Social History Social History   Tobacco Use   Smoking status: Never    Passive exposure: Past   Smokeless tobacco: Never  Vaping Use   Vaping status: Never Used  Substance Use Topics   Alcohol use: No   Drug use: No     Allergies   Amoxicillin , Banana, Cefdinir , Corn oil, Egg-derived products, Gluten meal, Other, Soy allergy (obsolete), Tilactase, Tinidazole, and Wheat   Review of Systems Review of Systems See HPI for relevant ROS.  Physical Exam Triage Vital Signs ED Triage Vitals  Encounter Vitals Group     BP 07/27/23 1708 115/72     Systolic BP Percentile --      Diastolic BP Percentile --      Pulse Rate 07/27/23 1708 93     Resp 07/27/23 1708 16     Temp 07/27/23 1708 99.3 F (37.4 C)     Temp Source 07/27/23 1708 Oral     SpO2 07/27/23 1708 94 %     Weight --      Height --      Head Circumference --      Peak Flow --      Pain Score 07/27/23 1711 6     Pain Loc --      Pain Education --      Exclude from Growth Chart --    No data found.  Updated Vital Signs BP 115/72   Pulse 93   Temp 99.3 F (37.4 C) (Oral)   Resp 16   LMP 07/09/2023 (Exact Date)   SpO2 94%   Visual Acuity Right Eye Distance:   Left Eye Distance:   Bilateral Distance:    Right Eye Near:   Left Eye Near:    Bilateral Near:     Physical Exam General: Alert and oriented, well-developed/well-nourished, calm, cooperative, no acute distress HEENT: Normocephalic atraumatic, moist mucous membranes, no scleral icterus, trachea midline, TMs nonbulging or erythematous or cloudy Lungs: Speaking full sentences, non-labored respirations, no distress Heart: Regular rate and rhythm Abdomen:  Soft, nondistended Musculoskeletal: Moves all extremities well Neurologic: Awake, A&O x4, gait normal Integumentary: Warm, dry, normal for ethnicity, intact, no rash Psychiatric: Appropriate mood & affect  UC  Treatments / Results  Labs (all labs ordered are listed, but only abnormal results are displayed) Labs Reviewed - No data to display  EKG   Radiology  No results found.  Procedures Procedures (including critical care time)  Medications Ordered in UC Medications - No data to display  Initial Impression / Assessment and Plan / UC Course  I have reviewed the triage vital signs and the nursing notes.  Pertinent labs & imaging results that were available during my care of the patient were reviewed by me and considered in my medical decision making (see chart for details).    Presents with left ear pain.  Differential diagnosis includes: Otitis media, otitis externa, eustachian tube dysfunction, dental infection, mastoiditis, sinusitis, URI, TMJ, cholesteatoma, including other diagnoses.  History obtained from: Patient.  Plan: Patient presents with left ear pain. No evidence of mastoiditis, sinusitis and patient at baseline mental status making intracranial abscess, meningitis, or other intracranial process unlikely. Symptoms are also not consistent with more concerning sepsis or focal bacterial infection. Therefore, patient likely stable for safe discharge home. Patient is tolerating POs and able to take medications as an outpatient. Patient should take Tylenol  or ibuprofen as needed for pain relief. Discussed strict return precautions including fever, worsening of symptoms, visible swelling and redness, changes in neurological status. Parent agreed with assessment and plan. Prescribed azithromycin  for treatment of possible otitis media.  Recommended patient call the manufacture first to confirm that she does not have any allergies to any of the ingredients in the azithromycin .  Lastly, made patient a referral to follow-up with ENT for further evaluation and management. Disposition: Stable for discharge.   All questions answered to the best of this examiner's ability. Advised to f/u with  PCP for further eval and/or reassessment. Patient agrees to plan.  An appropriate evaluation has been performed, and in my medical judgment there is currently no evidence of an immediate life-threatening or surgical condition. Discharge is therefore indicated at this time.  This document was created using the aid of voice recognition Scientist, clinical (histocompatibility and immunogenetics).  Final Clinical Impressions(s) / UC Diagnoses   Final diagnoses:  Left ear pain     Discharge Instructions      We have sent in an antibiotic to cover for possible ear infection.  We recommend calling the manufacturer first to confirm that you do not have any allergies to the ingredients in the azithromycin .  You can also start Flonase  and a decongestant as well. Additionally, we have made you a referral to follow-up with ENT.  Please call us  if you have not heard from them by the end of next week.  Please return or go to the emergency department if you have any significant swelling behind the ear, fever, worsening of symptoms, neurologic changes, or if you have any other concerns.  ED Prescriptions     Medication Sig Dispense Auth. Provider   azithromycin  (ZITHROMAX ) 250 MG tablet Take 1 tablet (250 mg total) by mouth daily. Take first 2 tablets together, then 1 every day until finished. 6 tablet Edna Gouty, PA-C      PDMP not reviewed this encounter.   Edna Gouty, PA-C 07/27/23 1742

## 2023-07-27 NOTE — Discharge Instructions (Addendum)
 We have sent in an antibiotic to cover for possible ear infection.  We recommend calling the manufacturer first to confirm that you do not have any allergies to the ingredients in the azithromycin .  You can also start Flonase  and a decongestant as well. Additionally, we have made you a referral to follow-up with ENT.  Please call us  if you have not heard from them by the end of next week.  Please return or go to the emergency department if you have any significant swelling behind the ear, fever, worsening of symptoms, neurologic changes, or if you have any other concerns.

## 2023-08-04 DIAGNOSIS — R1031 Right lower quadrant pain: Secondary | ICD-10-CM | POA: Diagnosis not present

## 2023-08-06 ENCOUNTER — Telehealth (INDEPENDENT_AMBULATORY_CARE_PROVIDER_SITE_OTHER): Payer: Self-pay | Admitting: Physician Assistant

## 2023-08-06 ENCOUNTER — Encounter (INDEPENDENT_AMBULATORY_CARE_PROVIDER_SITE_OTHER): Payer: Self-pay

## 2023-08-06 NOTE — Telephone Encounter (Signed)
 Received voicemail message from patient.  Called patient to schedule an appt.  LVM requesting a call back.  Also sent MyChart msg.

## 2023-08-11 ENCOUNTER — Ambulatory Visit: Payer: Self-pay | Admitting: Emergency Medicine

## 2023-08-11 ENCOUNTER — Ambulatory Visit
Admission: RE | Admit: 2023-08-11 | Discharge: 2023-08-11 | Disposition: A | Discharge status: Home / Self Care | Source: Ambulatory Visit | Attending: Emergency Medicine | Admitting: Emergency Medicine

## 2023-08-11 ENCOUNTER — Ambulatory Visit

## 2023-08-11 VITALS — BP 111/76 | HR 89 | Temp 98.4°F | Resp 17

## 2023-08-11 DIAGNOSIS — R1031 Right lower quadrant pain: Secondary | ICD-10-CM | POA: Diagnosis not present

## 2023-08-11 DIAGNOSIS — K5900 Constipation, unspecified: Secondary | ICD-10-CM

## 2023-08-11 LAB — POCT URINALYSIS DIP (MANUAL ENTRY)
Bilirubin, UA: NEGATIVE
Glucose, UA: NEGATIVE mg/dL
Ketones, POC UA: NEGATIVE mg/dL
Leukocytes, UA: NEGATIVE
Nitrite, UA: NEGATIVE
Protein Ur, POC: NEGATIVE mg/dL
Spec Grav, UA: 1.02 (ref 1.010–1.025)
Urobilinogen, UA: 1 U/dL
pH, UA: 7.5 (ref 5.0–8.0)

## 2023-08-11 LAB — POCT URINE PREGNANCY: Preg Test, Ur: NEGATIVE

## 2023-08-11 MED ORDER — DOCUSATE SODIUM 100 MG PO CAPS: 100.0000 mg | ORAL_CAPSULE | Freq: Two times a day (BID) | ORAL | 0 refills | Status: AC

## 2023-08-11 MED ORDER — FLEET ENEMA RE ENEM: 1.0000 | ENEMA | Freq: Every day | RECTAL | 5 refills | Status: AC | PRN

## 2023-08-11 NOTE — ED Provider Notes (Signed)
 [facility]    CSN: [id number] Arrival date & time: 08/11/23  0825      History   Chief Complaint Chief Complaint  Patient presents with   Abdominal Pain    Entered by patient    HPI Meghan Hayes is a 33 y.o. female.   Patient presents to clinic with her mother over concerns of right sided abdominal pain over the past 5 days.  Has had some nausea, no vomiting.  Has not had diarrhea. Feels like her right side of her abdomen is more firm than left.  Has had ongoing issues with constipation.  Last bowel movement was small and 3 days ago, bowel movement before then was around 8 days ago and was still small.  Has had issues evacuating her bowels and has been taking MiraLAX without much improvement.  Has not had fever.  Pain is constant but will intermittently become sharper and more intense.  Feels it radiating from the right side to her left side and occasionally into her back.  Symptoms coincided with the onset of her menstrual cycle.  Is not having urinary symptoms.  Did see OB/GYN for this on 08/04/23 where it was determined that this was not a GYN issue.  Did not have imaging at this visit.  The history is provided by medical records and the patient.  Abdominal Pain   Past Medical History:  Diagnosis Date   Allergy    seasonal, nuts, wheat, corn, soy   Arthritis 03/2021   Complication of ear piercing, initial encounter 03/23/2022   COVID-19 03/03/2021   Gallbladder polyp 2011   6 mm has had repeat imaging and stable.    GERD (gastroesophageal reflux disease)    Hashimoto's disease 2014   Ileocecal valve syndrome 2017   followed by dr. General Kenner   Migraines    Thyroid  nodule 2014   last US  2016: 1.2cm thyroid  nodule vs PTH adenoma.     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   Vitamin D  deficiency 04/14/2016   Thyroid  nodule 04/14/2016   GERD (gastroesophageal reflux disease)    Periumbilical abdominal pain 08/20/2014   Hashimoto's thyroiditis 07/26/2012   Dysphagia 03/24/2007   Gastro-esophageal reflux disease with esophagitis 02/25/2007    Past Surgical History:  Procedure Laterality Date   CHOLECYSTECTOMY     TONSILLECTOMY  2008   WISDOM TOOTH EXTRACTION  23   has panic attack and crying after procedure    OB History     Gravida  0   Para      Term      Preterm      AB      Living         SAB      IAB      Ectopic      Multiple      Live Births               Home Medications    Prior to Admission medications   Medication Sig Start Date End Date Taking? Authorizing Provider  docusate sodium (COLACE) 100 MG capsule Take 1 capsule (100 mg total) by mouth every 12 (twelve) hours. 08/11/23  Yes Erico Stan  N, FNP  sodium phosphate (FLEET) ENEM Place 133 mLs (1 enema total) rectally daily as needed for severe constipation. 08/11/23  Yes Aldrin Engelhard  N, FNP  diphenhydrAMINE  (BENADRYL ) 25 mg capsule Take 25 mg by mouth as needed for allergies.    [provider]  EPINEPHrine  0.3 mg/0.3 mL IJ SOAJ injection INJECT 0.3 MLS (0.3 MG TOTAL) INTO THE MUSCLE ONCE AS NEEDED FOR ANAPHYLAXIS. 01/31/19   Kuneff, Renee A, DO  fluticasone  (FLONASE ) 50 MCG/ACT nasal spray PLACE 2 SPRAYS INTO BOTH NOSTRILS 2 (TWO) TIMES DAILY 05/28/23   Kuneff, Renee A, DO  pantoprazole  (PROTONIX ) 40 MG tablet TAKE 1 TABLET BY MOUTH TWICE A DAY 03/19/23   Kennedy-Smith, Colleen M, NP  thyroid  (NP THYROID ) 30 MG tablet Take by mouth. 04/04/16    [provider]    Family History Family History  Problem Relation Age of Onset   Colon polyps Mother    Hypertension Father    Allergies Father    Hyperlipidemia Father    Colon polyps Father    Hypertension Paternal Uncle    Hypertension Maternal Grandmother    Colon cancer Maternal Grandmother 33       colon   Esophageal cancer Neg Hx    Rectal cancer Neg Hx    Stomach cancer Neg Hx     Social History Social History   Tobacco Use   Smoking status: Never    Passive exposure: Past   Smokeless tobacco: Never  Vaping Use   Vaping status: Never Used  Substance Use Topics   Alcohol use: No   Drug use: No     Allergies   Amoxicillin , Banana, Cefdinir , Corn oil, Egg-derived products, Gluten meal, Other, Soy allergy (obsolete), Tilactase, Tinidazole, and Wheat   Review of Systems Review of Systems  Per HPI  Physical Exam Triage Vital Signs ED Triage Vitals  Encounter Vitals Group     BP 08/11/23 0833 111/76     Girls Systolic BP Percentile --      Girls Diastolic BP Percentile --      Boys Systolic BP Percentile --      Boys Diastolic BP Percentile --      Pulse Rate 08/11/23 0833 89     Resp 08/11/23 0833 17     Temp 08/11/23 0833 98.4 F (36.9 C)     Temp Source 08/11/23 0833 Oral     SpO2 08/11/23 0833 100 %     Weight --      Height --      Head Circumference --      Peak Flow --      Pain Score 08/11/23 0836 6     Pain Loc --      Pain Education --      Exclude from Growth Chart --    No data found.  Updated Vital Signs BP 111/76 (BP Location: Right Arm)   Pulse 89   Temp 98.4 F (36.9 C) (Oral)   Resp 17   LMP 08/06/2023 (Exact Date)   SpO2 100%   Visual Acuity Right Eye Distance:   Left Eye Distance:   Bilateral Distance:    Right Eye Near:   Left Eye Near:    Bilateral Near:     Physical Exam Vitals and nursing note reviewed.  Constitutional:      Appearance: She is  well-developed.  HENT:     Head: Normocephalic  and atraumatic.     Mouth/Throat:     Mouth: Mucous membranes are moist.   Eyes:     General: No scleral icterus.   Cardiovascular:     Rate and Rhythm: Normal rate.  Pulmonary:     Effort: Pulmonary effort is normal. No respiratory distress.  Abdominal:     General: Abdomen is flat. Bowel sounds are normal.     Palpations: Abdomen is soft.     Tenderness: There is abdominal tenderness in the right lower quadrant. There is no right CVA tenderness, left CVA tenderness, guarding or rebound. Negative signs include Murphy's sign and Rovsing's sign.     Hernia: No hernia is present.   Skin:    General: Skin is warm and dry.   Neurological:     General: No focal deficit present.     Mental Status: She is alert.   Psychiatric:        Mood and Affect: Mood normal.      UC Treatments / Results  Labs (all labs ordered are listed, but only abnormal results are displayed) Labs Reviewed  POCT URINALYSIS DIP (MANUAL ENTRY) - Abnormal; Notable for the following components:      Result Value   Blood, UA moderate (*)    All other components within normal limits  POCT URINE PREGNANCY    EKG   Radiology No results found.  Procedures Procedures (including critical care time)  Medications Ordered in UC Medications - No data to display  Initial Impression / Assessment and Plan / UC Course  I have reviewed the triage vital signs and the nursing notes.  Pertinent labs & imaging results that were available during my care of the patient were reviewed by me and considered in my medical decision making (see chart for details).  Vitals in triage reviewed, patient is hemodynamically stable.  Active bowel sounds, soft abdomen and mild right-sided abdominal tenderness without rebound or guarding.  Low concern for acute abdomen at this time.  Negative for CVA tenderness.  UA unremarkable.  Urine pregnancy negative.  Has not had a good bowel movement in well over a week.  Abdominal imaging  shows stool burden, suspect constipation.  Patient hesitant to try Fleet enema, will try MiraLAX cleanout and stool softener.  Encourage PCP follow-up.  Strict emergency precautions given if symptoms evolve.  Plan of care, follow-up care return precautions given, no questions at this time.     Final Clinical Impressions(s) / UC Diagnoses   Final diagnoses:  Right lower quadrant abdominal pain  Constipation, unspecified constipation type     Discharge Instructions      Overall, your physical exam is reassuring.  I suspect your right sided abdominal pain may be due from constipation. You can try the fleet enema (recommended) or take miralax 3x throughout today to attempt to induce a bowel movement. Seek immediate care at the nearest emergency department if symptoms worsen or change in any way.  Follow-up with your primary care for continued symptoms.  For moderate to severe constipation (not having a bowel movement in more than 3 days) then try to use an enema or Miralax once daily until you have a good bowel movement.  It is not a good idea to use an enema or laxatives daily. If you find you are doing this, then please follow up with a gastroenterologist. Otherwise, a medication you could use daily to help with promoting bowel movements  is docusate (Colace) 100mg . It is okay to use this 1-2 times daily as a stool softener.  Try to stay active physically including regular exercise 2-3 times a week.  Make sure you hydrate well every day with about 64 ounces of water daily (that is 2 liters).  Try to avoid carb heavy foods, dairy. This includes cutting out breads, pasta, pizza, pastries, potatoes, rice, starchy foods in general. Eat more fiber as listed below:  Salads - kale, spinach, cabbage, spring mix, arugula Fruits - avocadoes, berries (blueberries, raspberries, blackberries), apples, oranges, pomegranate, grapefruit, kiwi Vegetables - asparagus, cauliflower, broccoli, green beans, brussel  sprouts, bell peppers, beets; stay away from or limit starchy vegetables like potatoes, carrots, peas Other general foods - kidney beans, egg whites, almonds, walnuts, sunflower seeds, pumpkin seeds, fat free yogurt, almond milk, flax seeds, quinoa, oats  Meat - It is better to eat lean meats and limit your red meat including pork to once a week.  Wild caught fish, chicken breast are good options as they tend to be leaner sources of good protein. Still be mindful of the sodium labels for the meats you buy.  DO NOT EAT ANY FOODS ON THIS LIST THAT YOU ARE ALLERGIC TO. For more specific needs, I highly recommend consulting a dietician or nutritionist but this can definitely be a good starting point.      ED Prescriptions     Medication Sig Dispense Auth. Provider   docusate sodium (COLACE) 100 MG capsule Take 1 capsule (100 mg total) by mouth every 12 (twelve) hours. 60 capsule Harlow Lighter, Lolamae Voisin  N, FNP   sodium phosphate (FLEET) ENEM Place 133 mLs (1 enema total) rectally daily as needed for severe constipation. 133 mL Harlow Lighter, Cowan Pilar  N, FNP      PDMP not reviewed this encounter.   Boone Buzzard, [place] 08/11/23 [phone]

## 2023-08-11 NOTE — Discharge Instructions (Signed)
 Overall, your physical exam is reassuring.  I suspect your right sided abdominal pain may be due from constipation. You can try the fleet enema (recommended) or take miralax 3x throughout today to attempt to induce a bowel movement. Seek immediate care at the nearest emergency department if symptoms worsen or change in any way.  Follow-up with your primary care for continued symptoms.  For moderate to severe constipation (not having a bowel movement in more than 3 days) then try to use an enema or Miralax once daily until you have a good bowel movement.  It is not a good idea to use an enema or laxatives daily. If you find you are doing this, then please follow up with a gastroenterologist. Otherwise, a medication you could use daily to help with promoting bowel movements is docusate (Colace) 100mg . It is okay to use this 1-2 times daily as a stool softener.  Try to stay active physically including regular exercise 2-3 times a week.  Make sure you hydrate well every day with about 64 ounces of water daily (that is 2 liters).  Try to avoid carb heavy foods, dairy. This includes cutting out breads, pasta, pizza, pastries, potatoes, rice, starchy foods in general. Eat more fiber as listed below:  Salads - kale, spinach, cabbage, spring mix, arugula Fruits - avocadoes, berries (blueberries, raspberries, blackberries), apples, oranges, pomegranate, grapefruit, kiwi Vegetables - asparagus, cauliflower, broccoli, green beans, brussel sprouts, bell peppers, beets; stay away from or limit starchy vegetables like potatoes, carrots, peas Other general foods - kidney beans, egg whites, almonds, walnuts, sunflower seeds, pumpkin seeds, fat free yogurt, almond milk, flax seeds, quinoa, oats  Meat - It is better to eat lean meats and limit your red meat including pork to once a week.  Wild caught fish, chicken breast are good options as they tend to be leaner sources of good protein. Still be mindful of the sodium labels  for the meats you buy.  DO NOT EAT ANY FOODS ON THIS LIST THAT YOU ARE ALLERGIC TO. For more specific needs, I highly recommend consulting a dietician or nutritionist but this can definitely be a good starting point.

## 2023-08-11 NOTE — ED Triage Notes (Signed)
 Pt c/o abd pain since Sat. Some nausea. Denies vomiting or diarrhea. Pain has been constant sharp cramping, sometimes intermittently worse.

## 2023-08-13 DIAGNOSIS — R110 Nausea: Secondary | ICD-10-CM | POA: Diagnosis not present

## 2023-08-13 DIAGNOSIS — N898 Other specified noninflammatory disorders of vagina: Secondary | ICD-10-CM | POA: Diagnosis not present

## 2023-08-13 DIAGNOSIS — R1030 Lower abdominal pain, unspecified: Secondary | ICD-10-CM | POA: Diagnosis not present

## 2023-08-13 DIAGNOSIS — K5900 Constipation, unspecified: Secondary | ICD-10-CM | POA: Diagnosis not present

## 2023-08-25 ENCOUNTER — Other Ambulatory Visit: Payer: Self-pay | Admitting: Family

## 2023-08-25 ENCOUNTER — Encounter: Payer: Self-pay | Admitting: Family

## 2023-08-25 DIAGNOSIS — E041 Nontoxic single thyroid nodule: Secondary | ICD-10-CM

## 2023-08-25 DIAGNOSIS — R109 Unspecified abdominal pain: Secondary | ICD-10-CM

## 2023-08-25 DIAGNOSIS — R1030 Lower abdominal pain, unspecified: Secondary | ICD-10-CM

## 2023-08-30 ENCOUNTER — Ambulatory Visit
Admission: RE | Admit: 2023-08-30 | Discharge: 2023-08-30 | Disposition: A | Discharge status: Home / Self Care | Source: Ambulatory Visit | Attending: Family | Admitting: Family

## 2023-08-30 DIAGNOSIS — R109 Unspecified abdominal pain: Secondary | ICD-10-CM

## 2023-09-01 ENCOUNTER — Ambulatory Visit
Admission: RE | Admit: 2023-09-01 | Discharge: 2023-09-01 | Disposition: A | Discharge status: Home / Self Care | Source: Ambulatory Visit | Attending: Family | Admitting: Family

## 2023-09-01 ENCOUNTER — Ambulatory Visit (INDEPENDENT_AMBULATORY_CARE_PROVIDER_SITE_OTHER): Admitting: Physician Assistant

## 2023-09-01 ENCOUNTER — Ambulatory Visit (INDEPENDENT_AMBULATORY_CARE_PROVIDER_SITE_OTHER): Admitting: Audiology

## 2023-09-01 ENCOUNTER — Encounter (INDEPENDENT_AMBULATORY_CARE_PROVIDER_SITE_OTHER): Payer: Self-pay | Admitting: Physician Assistant

## 2023-09-01 ENCOUNTER — Other Ambulatory Visit: Payer: Self-pay | Admitting: Family

## 2023-09-01 VITALS — BP 110/76 | HR 87

## 2023-09-01 DIAGNOSIS — H93292 Other abnormal auditory perceptions, left ear: Secondary | ICD-10-CM

## 2023-09-01 DIAGNOSIS — H9209 Otalgia, unspecified ear: Secondary | ICD-10-CM | POA: Diagnosis not present

## 2023-09-01 DIAGNOSIS — H6992 Unspecified Eustachian tube disorder, left ear: Secondary | ICD-10-CM

## 2023-09-01 DIAGNOSIS — R1030 Lower abdominal pain, unspecified: Secondary | ICD-10-CM | POA: Diagnosis not present

## 2023-09-01 DIAGNOSIS — Z0110 Encounter for examination of ears and hearing without abnormal findings: Secondary | ICD-10-CM | POA: Diagnosis not present

## 2023-09-01 DIAGNOSIS — E041 Nontoxic single thyroid nodule: Secondary | ICD-10-CM

## 2023-09-01 MED ORDER — FLUTICASONE PROPIONATE 50 MCG/ACT NA SUSP: 2.0000 | Freq: Every day | NASAL | 6 refills | Status: AC

## 2023-09-01 NOTE — Progress Notes (Addendum)
 Dear Dr. Melonie, Here is my assessment for our mutual patient, Meghan Hayes. Thank you for allowing me the opportunity to care for your patient. Please do not hesitate to contact me should you have any other questions. Sincerely, Chyrl Cohen PA-C  Otolaryngology Clinic Note Referring provider: Dr. Melonie HPI:  Meghan Hayes is a 33 y.o. female kindly referred by Dr. Melonie   The patient is a 33 year old female seen in our office for evaluation of ear pain.  The patient notes that in November 2024 she had an episode of otitis media.  She notes that since 2016 she has had approximately 1-2 episodes of otitis media in the left ear.  She notes that since her recent episode in November she has had consistent pressure and abnormal sensation in the left ear.  She notes some intermittent pain, no persistent deep pain, predominantly fullness.  She notes occasional tonal tinnitus that is not persistent with no pulsatile characteristics.  She notes when flying the pressure does increase in the ear but is not severe.  She does note some popping in the ear that is not frequent.  She was most recently seen by her primary care provider approximate 1 month ago with no obvious infection.  She notes a significant history of food and environmental allergies.  She has previously seen allergy specialist and did start shots but notes that this did not significantly prove her symptoms so she discontinued them.  She has tried nasal sprays in the past including Flonase  which caused nosebleeds.  She notes some intermittent postnasal drainage that has been present for several years.  Additionally she notes clicking and popping of her jaw, she does grind her teeth, she does have a mouthguard that was recently refitted.  No history of head or neck surgery.    Independent Review of Additional Tests or Records:   Audiological evaluation 09/01/2023 normal with right type As tympanometry   PMH/Meds/All/SocHx/FamHx/ROS:    Past Medical History:  Diagnosis Date   Allergy    seasonal, nuts, wheat, corn, soy   Arthritis 03/2021   Complication of ear piercing, initial encounter 03/23/2022   COVID-19 03/03/2021   Gallbladder polyp 2011   6 mm has had repeat imaging and stable.    GERD (gastroesophageal reflux disease)    Hashimoto's disease 2014   Ileocecal valve syndrome 2017   followed by dr. Leigh   Migraines    Thyroid  nodule 2014   last US  2016: 1.2cm thyroid  nodule vs PTH adenoma.      Past Surgical History:  Procedure Laterality Date   CHOLECYSTECTOMY     TONSILLECTOMY  2008   WISDOM TOOTH EXTRACTION  23   has panic attack and crying after procedure    Family History  Problem Relation Age of Onset   Colon polyps Mother    Hypertension Father    Allergies Father    Hyperlipidemia Father    Colon polyps Father    Hypertension Paternal Uncle    Hypertension Maternal Grandmother    Colon cancer Maternal Grandmother 47       colon   Esophageal cancer Neg Hx    Rectal cancer Neg Hx    Stomach cancer Neg Hx      Social Connections: Unknown (06/29/2021)   Received from [facility]   Social Network    Social Network: Not on file      Current Outpatient Medications:    diphenhydrAMINE  (BENADRYL ) 25 mg capsule, Take 25 mg by mouth as needed  for allergies., Disp: , Rfl:    docusate sodium  (COLACE) 100 MG capsule, Take 1 capsule (100 mg total) by mouth every 12 (twelve) hours. (Patient not taking: Reported on 09/01/2023), Disp: 60 capsule, Rfl: 0   EPINEPHrine  0.3 mg/0.3 mL IJ SOAJ injection, INJECT 0.3 MLS (0.3 MG TOTAL) INTO THE MUSCLE ONCE AS NEEDED FOR ANAPHYLAXIS., Disp: 1 each, Rfl: 1   fluticasone  (FLONASE ) 50 MCG/ACT nasal spray, PLACE 2 SPRAYS INTO BOTH NOSTRILS 2 (TWO) TIMES DAILY (Patient not taking: Reported on 09/01/2023), Disp: 16 mL, Rfl: 1   pantoprazole  (PROTONIX ) 40 MG tablet, TAKE 1 TABLET BY MOUTH TWICE A DAY, Disp: 180 tablet, Rfl: 1   sodium phosphate (FLEET) ENEM,  Place 133 mLs (1 enema total) rectally daily as needed for severe constipation. (Patient not taking: Reported on 09/01/2023), Disp: 133 mL, Rfl: 5   thyroid  (NP THYROID ) 30 MG tablet, Take by mouth., Disp: , Rfl:    Physical Exam:   BP 110/76   Pulse 87   LMP 08/06/2023 (Exact Date)   SpO2 98%   Pertinent Findings  CN II-XII intact Bilateral EAC clear and TM intact with well pneumatized middle ear spaces Anterior rhinoscopy: Septum midline; bilateral inferior turbinates with moderate hypertrophy No lesions of oral cavity/oropharynx; dentition wnl No obviously palpable neck masses/lymphadenopathy/thyromegaly No respiratory distress or stridor   Seprately Identifiable Procedures:  None  Impression & Plans:  Meghan Hayes is a 33 y.o. female with the following   Ear discomfort-  33 year old female presenting today with ear related complaints.  The patient describes what sounds like a level of eustachian tube dysfunction with pressure, some popping and increased discomfort with airplane travel.  She has tried Flonase  previously but had nosebleeds with using this.  I would recommend attempting this again, I counseled her on proper use of Flonase , she may also use nasal saline irrigation and Vaseline.  Given her significant level of allergies both historically and on exam I would recommend repeat evaluation with allergy specialist as she may be a candidate for shots.  She has no red flags on exam today her audiogram was reassuring with normal left tympanometry and normal audiogram.  She will attempt medical management, if she develops any new or worsening signs or symptoms I like to see her back in the office.  The patient was given strict return precautions.  She verbalized understanding and agreement to today's plan had no further questions or concerns.   - f/u prn   Thank you for allowing me the opportunity to care for your patient. Please do not hesitate to contact me should you have any  other questions.  Sincerely, Chyrl Cohen PA-C [facility] Phone: [phone] Fax: [phone]  09/01/2023, 9:53 AM

## 2023-09-02 NOTE — Progress Notes (Signed)
  [address] [place], [place] [address] [phone]  Audiological Evaluation    Name: Meghan Hayes     DOB:   23-Dec-1990      MRN:   [id number]                                                                                     Service Date: 09/01/2023     Accompanied by: unaccompanied   Patient comes today after Reyes Cohen, PA-C sent a referral for a hearing evaluation due to concerns with ear pain.   Symptoms Yes Details  Hearing loss  [x]  Left ear seems muffled  Tinnitus  []    Ear pain/ infections/pressure  [x]  Left ear pain/ OM per MD's  Balance problems  []    Noise exposure history  []    Previous ear surgeries  []    Family history of hearing loss  []    Amplification  []    Other  [x]  Reports to grind her teeth at night    Otoscopy: Right ear: Clear external ear canal and notable landmarks visualized on the tympanic membrane. Left ear:  Clear external ear canal and notable landmarks visualized on the tympanic membrane.  Tympanometry: Right ear: Type As- Normal external ear canal volume with normal middle ear pressure and low tympanic membrane compliance. Left ear: Type A- Normal external ear canal volume with normal middle ear pressure and tympanic membrane compliance.    Pure tone Audiometry:  Normal hearing from [phone] , in both ears.    Speech Audiometry: Right ear- Speech Reception Threshold (SRT) was obtained at 5 dBHL. Left ear-Speech Reception Threshold (SRT) was obtained at 5 dBHL.   Word Recognition Score Tested using NU-6 (recorded) Right ear: 100% was obtained at a presentation level of 50 dBHL with contralateral masking which is deemed as  excellent. Left ear: 100% was obtained at a presentation level of 50 dBHL with contralateral masking which is deemed as  excellent.   The hearing test results were completed under headphones and results are deemed to be of good reliability. Test technique:  conventional      Recommendations: Follow up with ENT as scheduled for today. Return for a hearing evaluation if concerns with hearing changes arise or per MD recommendation.   Meghan Hayes, AUD

## 2023-09-21 ENCOUNTER — Telehealth: Payer: Self-pay | Admitting: Internal Medicine

## 2023-09-21 NOTE — Telephone Encounter (Signed)
 Good Morning Dr. Abran, I received a call from this patient stated that she would like to schedule for a colonoscopy. Patient states that she has been battling through abdominal pain for over a year now and has no resolution to her pain. Patient had a last colonoscopy back in October of 2020. Would you please advise on scheduling this patient.    Thank you.

## 2023-09-22 NOTE — Telephone Encounter (Signed)
 It is not clear that colonoscopy is the next best step. For ongoing abdominal pain, please get her an office appointment with myself or any one of the advanced practitioners

## 2023-09-22 NOTE — Telephone Encounter (Signed)
 Patient has been scheduled for a OV with Ellouise Console on 9/29 at 9:20

## 2023-10-26 ENCOUNTER — Encounter: Payer: Self-pay | Admitting: Sports Medicine

## 2023-10-26 ENCOUNTER — Ambulatory Visit: Payer: Self-pay | Admitting: Allergy

## 2023-10-26 NOTE — Progress Notes (Deleted)
 New Patient Note  RE: Meghan Hayes MRN: [id number] DOB: 08-23-1990 Date of Office Visit: 10/26/2023  Consult requested by: Palmer Purchase, PA-C Primary care provider: Catherine Charlies LABOR, DO  Chief Complaint: No chief complaint on file.  History of Present Illness: I had the pleasure of seeing Meghan Hayes for initial evaluation at the [facility] on 10/26/2023. She is a 33 y.o. female, who is referred here by Palmer Purchase, PA-C for the evaluation of environmental allergies.  Discussed the use of AI scribe software for clinical note transcription with the patient, who gave verbal consent to proceed.  History of Present Illness             09/01/2023 ENT visit: 33 year old female presenting today with ear related complaints. The patient describes what sounds like a level of eustachian tube dysfunction with pressure, some popping and increased discomfort with airplane travel. She has tried Flonase  previously but had nosebleeds with using this. I would recommend attempting this again, I counseled her on proper use of Flonase , she may also use nasal saline irrigation and Vaseline. Given her significant level of allergies both historically and on exam I would recommend repeat evaluation with allergy specialist as she may be a candidate for shots. She has no red flags on exam today her audiogram was reassuring with normal left tympanometry and normal audiogram. She will attempt medical management, if she develops any new or worsening signs or symptoms I like to see her back in the office. The patient was given strict return precautions. She verbalized understanding and agreement to today's plan had no further questions or concerns.  Assessment and Plan: Edgar is a 33 y.o. female with: ***  Assessment and Plan               No follow-ups on file.  No orders of the defined types were placed in this encounter.  Lab Orders  No laboratory test(s) ordered today     Other allergy screening: Asthma: {Blank single:19197::yes,no} Rhino conjunctivitis: {Blank single:19197::yes,no} Food allergy: {Blank single:19197::yes,no} Medication allergy: {Blank single:19197::yes,no} Hymenoptera allergy: {Blank single:19197::yes,no} Urticaria: {Blank single:19197::yes,no} Eczema:{Blank single:19197::yes,no} History of recurrent infections suggestive of immunodeficency: {Blank single:19197::yes,no}  Diagnostics: Spirometry:  Tracings reviewed. Her effort: {Blank single:19197::Good reproducible efforts.,It was hard to get consistent efforts and there is a question as to whether this reflects a maximal maneuver.,Poor effort, data can not be interpreted.} FVC: ***L FEV1: ***L, ***% predicted FEV1/FVC ratio: ***% Interpretation: {Blank single:19197::Spirometry consistent with mild obstructive disease,Spirometry consistent with moderate obstructive disease,Spirometry consistent with severe obstructive disease,Spirometry consistent with possible restrictive disease,Spirometry consistent with mixed obstructive and restrictive disease,Spirometry uninterpretable due to technique,Spirometry consistent with normal pattern,No overt abnormalities noted given today's efforts}.  Please see scanned spirometry results for details.  Skin Testing: {Blank single:19197::Select foods,Environmental allergy panel,Environmental allergy panel and select foods,Food allergy panel,None,Deferred due to recent antihistamines use}. *** Results discussed with patient/family.   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   LAD (lymphadenopathy) of right  cervical region 01/18/2018   Pain of right mastoid 01/18/2018   Right lumbar radiculitis 07/20/2017   Patellofemoral syndrome, left 07/06/2017   Tibialis posterior tendinitis 08/10/2016   Vitamin D  deficiency 04/14/2016   Thyroid  nodule 04/14/2016   GERD (  gastroesophageal reflux disease)    Periumbilical abdominal pain 08/20/2014   Hashimoto's thyroiditis 07/26/2012   Dysphagia 03/24/2007   Gastro-esophageal reflux disease with esophagitis 02/25/2007   Past Medical History:  Diagnosis Date   Allergy    seasonal, nuts, wheat, corn, soy   Arthritis 03/2021   Complication of ear piercing, initial encounter 03/23/2022   COVID-19 03/03/2021   Gallbladder polyp 2011   6 mm has had repeat imaging and stable.    GERD (gastroesophageal reflux disease)    Hashimoto's disease 2014   Ileocecal valve syndrome 2017   followed by dr. Leigh   Migraines    Thyroid  nodule 2014   last US  2016: 1.2cm thyroid  nodule vs PTH adenoma.    Past Surgical History: Past Surgical History:  Procedure Laterality Date   CHOLECYSTECTOMY     TONSILLECTOMY  2008   WISDOM TOOTH EXTRACTION  21   has panic attack and crying after procedure   Medication List:  Current Outpatient Medications  Medication Sig Dispense Refill   diphenhydrAMINE  (BENADRYL ) 25 mg capsule Take 25 mg by mouth as needed for allergies.     docusate sodium  (COLACE) 100 MG capsule Take 1 capsule (100 mg total) by mouth every 12 (twelve) hours. (Patient not taking: Reported on 09/01/2023) 60 capsule 0   EPINEPHrine  0.3 mg/0.3 mL IJ SOAJ injection INJECT 0.3 MLS (0.3 MG TOTAL) INTO THE MUSCLE ONCE AS NEEDED FOR ANAPHYLAXIS. 1 each 1   fluticasone  (FLONASE ) 50 MCG/ACT nasal spray Place 2 sprays into both nostrils daily. 16 g 6   pantoprazole  (PROTONIX ) 40 MG tablet TAKE 1 TABLET BY MOUTH TWICE A DAY 180 tablet 1   sodium phosphate (FLEET) ENEM Place 133 mLs (1 enema total) rectally daily as needed for severe constipation. (Patient not  taking: Reported on 09/01/2023) 133 mL 5   thyroid  (NP THYROID ) 30 MG tablet Take by mouth.     No current facility-administered medications for this visit.   Allergies: Allergies  Allergen Reactions   Amoxicillin  Anaphylaxis    Pt reported   Banana Anaphylaxis   Cefdinir  Anaphylaxis, Rash and Swelling    throat   Corn Oil Anaphylaxis   Egg-Derived Products Anaphylaxis and Swelling    abd swelling   Gluten Meal Anaphylaxis   Other Anaphylaxis and Swelling    All dairy products make intestines swell abd swelling All nuts ALL nuts   Soy Allergy (Obsolete) Anaphylaxis and Swelling   Tilactase Swelling    All dairy products make intestines swell All dairy products make intestines swell All dairy products make intestines swell   Tinidazole Anaphylaxis and Rash   Wheat Anaphylaxis   Social History: Social History   Socioeconomic History   Marital status: Single    Spouse name: Not on file   Number of children: 0   Years of education: BA   Highest education level: Not on file  Occupational History   Not on file  Tobacco Use   Smoking status: Never    Passive exposure: Past   Smokeless tobacco: Never  Vaping Use   Vaping status: Never Used  Substance and Sexual Activity   Alcohol use: No   Drug use: No   Sexual activity: Never  Other Topics Concern   Not on file  Social History Narrative   Pt is single. Just finished her BA degree ans searching for employment.    Takes a daily vitamin.    Wears her seatbelt and bicycle helmet.    Smoke detector in  the home.    Feels safe in her relationships.    Social Drivers of Corporate investment banker Strain: Not on file  Food Insecurity: No Food Insecurity (03/10/2021)   Received from [facility]   Hunger Vital Sign    Within the past 12 months, you worried that your food would run out before you got the money to buy more.: Never true    Within the past 12 months, the food you bought just didn't last and you didn't  have money to get more.: Never true  Transportation Needs: Not on file  Physical Activity: Not on file  Stress: Not on file  Social Connections: Unknown (06/29/2021)   Received from [facility]   Social Network    Social Network: Not on file   Lives in a ***. Smoking: *** Occupation: ***  Environmental HistorySurveyor, minerals in the house: Network engineer in the family room: {Blank single:19197::yes,no} Carpet in the bedroom: {Blank single:19197::yes,no} Heating: {Blank single:19197::electric,gas,heat pump} Cooling: {Blank single:19197::central,window,heat pump} Pet: {Blank single:19197::yes ***,no}  Family History: Family History  Problem Relation Age of Onset   Colon polyps Mother    Hypertension Father    Allergies Father    Hyperlipidemia Father    Colon polyps Father    Hypertension Paternal Uncle    Hypertension Maternal Grandmother    Colon cancer Maternal Grandmother 5       colon   Esophageal cancer Neg Hx    Rectal cancer Neg Hx    Stomach cancer Neg Hx    Problem                               Relation Asthma                                   *** Eczema                                *** Food allergy                          *** Allergic rhino conjunctivitis     ***  Review of Systems  Constitutional:  Negative for appetite change, chills, fever and unexpected weight change.  HENT:  Negative for congestion and rhinorrhea.   Eyes:  Negative for itching.  Respiratory:  Negative for cough, chest tightness, shortness of breath and wheezing.   Cardiovascular:  Negative for chest pain.  Gastrointestinal:  Negative for abdominal pain.  Genitourinary:  Negative for difficulty urinating.  Skin:  Negative for rash.  Neurological:  Negative for headaches.    Objective: There were no vitals taken for this visit. There is no height or weight on file to calculate BMI. Physical Exam Vitals and nursing note  reviewed.  Constitutional:      Appearance: Normal appearance. She is well-developed.  HENT:     Head: Normocephalic and atraumatic.     Right Ear: Tympanic membrane and external ear normal.     Left Ear: Tympanic membrane and external ear normal.     Nose: Nose normal.     Mouth/Throat:     Mouth: Mucous membranes are moist.     Pharynx: Oropharynx is clear.  Eyes:     Conjunctiva/sclera: Conjunctivae normal.  Cardiovascular:     Rate and Rhythm: Normal rate and regular rhythm.     Heart sounds: Normal heart sounds. No murmur heard.    No friction rub. No gallop.  Pulmonary:     Effort: Pulmonary effort is normal.     Breath sounds: Normal breath sounds. No wheezing, rhonchi or rales.  Musculoskeletal:     Cervical back: Neck supple.  Skin:    General: Skin is warm.     Findings: No rash.  Neurological:     Mental Status: She is alert and oriented to person, place, and time.  Psychiatric:        Behavior: Behavior normal.    The plan was reviewed with the patient/family, and all questions/concerned were addressed.  It was my pleasure to see Meghan Hayes today and participate in her care. Please feel free to contact me with any questions or concerns.  Sincerely,  Orlan Cramp, DO Allergy & Immunology  [facility]  [facility] office: [phone] [facility] office: [phone]

## 2023-10-27 DIAGNOSIS — D520 Dietary folate deficiency anemia: Secondary | ICD-10-CM | POA: Diagnosis not present

## 2023-10-27 DIAGNOSIS — E7211 Homocystinuria: Secondary | ICD-10-CM | POA: Diagnosis not present

## 2023-10-27 DIAGNOSIS — E568 Deficiency of other vitamins: Secondary | ICD-10-CM | POA: Diagnosis not present

## 2023-10-27 DIAGNOSIS — E531 Pyridoxine deficiency: Secondary | ICD-10-CM | POA: Diagnosis not present

## 2023-11-09 ENCOUNTER — Ambulatory Visit: Payer: Self-pay | Admitting: Allergy

## 2023-11-09 NOTE — Progress Notes (Deleted)
 New Patient Note  RE: Meghan Hayes MRN: [id number] DOB: [birth year] Date of Office Visit: 11/09/2023  Consult requested by: Palmer Purchase, PA-C Primary care provider: Catherine Charlies LABOR, DO  Chief Complaint: No chief complaint on file.  History of Present Illness: I had the pleasure of seeing Meghan Hayes for initial evaluation at the [facility] on 11/09/2023. She is a 33 y.o. female, who is referred here by Palmer Purchase, PA-C for the evaluation of environmental allergies.  Discussed the use of AI scribe software for clinical note transcription with the patient, who gave verbal consent to proceed.  History of Present Illness             09/01/2023 ENT visit: 33 year old female presenting today with ear related complaints. The patient describes what sounds like a level of eustachian tube dysfunction with pressure, some popping and increased discomfort with airplane travel. She has tried Flonase  previously but had nosebleeds with using this. I would recommend attempting this again, I counseled her on proper use of Flonase , she may also use nasal saline irrigation and Vaseline. Given her significant level of allergies both historically and on exam I would recommend repeat evaluation with allergy specialist as she may be a candidate for shots. She has no red flags on exam today her audiogram was reassuring with normal left tympanometry and normal audiogram. She will attempt medical management, if she develops any new or worsening signs or symptoms I like to see her back in the office. The patient was given strict return precautions. She verbalized understanding and agreement to today's plan had no further questions or concerns.  Assessment and Plan: Meghan Hayes is a 33 y.o. female with: ***  Assessment and Plan               No follow-ups on file.  No orders of the defined types were placed in this encounter.  Lab Orders  No laboratory test(s) ordered today     Other allergy screening: Asthma: {Blank single:19197::yes,no} Rhino conjunctivitis: {Blank single:19197::yes,no} Food allergy: {Blank single:19197::yes,no} Medication allergy: {Blank single:19197::yes,no} Hymenoptera allergy: {Blank single:19197::yes,no} Urticaria: {Blank single:19197::yes,no} Eczema:{Blank single:19197::yes,no} History of recurrent infections suggestive of immunodeficency: {Blank single:19197::yes,no}  Diagnostics: Spirometry:  Tracings reviewed. Her effort: {Blank single:19197::Good reproducible efforts.,It was hard to get consistent efforts and there is a question as to whether this reflects a maximal maneuver.,Poor effort, data can not be interpreted.} FVC: ***L FEV1: ***L, ***% predicted FEV1/FVC ratio: ***% Interpretation: {Blank single:19197::Spirometry consistent with mild obstructive disease,Spirometry consistent with moderate obstructive disease,Spirometry consistent with severe obstructive disease,Spirometry consistent with possible restrictive disease,Spirometry consistent with mixed obstructive and restrictive disease,Spirometry uninterpretable due to technique,Spirometry consistent with normal pattern,No overt abnormalities noted given today's efforts}.  Please see scanned spirometry results for details.  Skin Testing: {Blank single:19197::Select foods,Environmental allergy panel,Environmental allergy panel and select foods,Food allergy panel,None,Deferred due to recent antihistamines use}. *** Results discussed with patient/family.   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   LAD (lymphadenopathy) of right  cervical region 01/18/2018   Pain of right mastoid 01/18/2018   Right lumbar radiculitis 07/20/2017   Patellofemoral syndrome, left 07/06/2017   Tibialis posterior tendinitis 08/10/2016   Vitamin D  deficiency 04/14/2016   Thyroid  nodule 04/14/2016   GERD (  gastroesophageal reflux disease)    Periumbilical abdominal pain 08/20/2014   Hashimoto's thyroiditis 07/26/2012   Dysphagia 03/24/2007   Gastro-esophageal reflux disease with esophagitis 02/25/2007   Past Medical History:  Diagnosis Date   Allergy    seasonal, nuts, wheat, corn, soy   Arthritis 03/2021   Complication of ear piercing, initial encounter 03/23/2022   COVID-19 03/03/2021   Gallbladder polyp 2011   6 mm has had repeat imaging and stable.    GERD (gastroesophageal reflux disease)    Hashimoto's disease 2014   Ileocecal valve syndrome 2017   followed by dr. Leigh   Migraines    Thyroid  nodule 2014   last US  2016: 1.2cm thyroid  nodule vs PTH adenoma.    Past Surgical History: Past Surgical History:  Procedure Laterality Date   CHOLECYSTECTOMY     TONSILLECTOMY  2008   WISDOM TOOTH EXTRACTION  21   has panic attack and crying after procedure   Medication List:  Current Outpatient Medications  Medication Sig Dispense Refill   diphenhydrAMINE  (BENADRYL ) 25 mg capsule Take 25 mg by mouth as needed for allergies.     docusate sodium  (COLACE) 100 MG capsule Take 1 capsule (100 mg total) by mouth every 12 (twelve) hours. (Patient not taking: Reported on 09/01/2023) 60 capsule 0   EPINEPHrine  0.3 mg/0.3 mL IJ SOAJ injection INJECT 0.3 MLS (0.3 MG TOTAL) INTO THE MUSCLE ONCE AS NEEDED FOR ANAPHYLAXIS. 1 each 1   fluticasone  (FLONASE ) 50 MCG/ACT nasal spray Place 2 sprays into both nostrils daily. 16 g 6   pantoprazole  (PROTONIX ) 40 MG tablet TAKE 1 TABLET BY MOUTH TWICE A DAY 180 tablet 1   sodium phosphate (FLEET) ENEM Place 133 mLs (1 enema total) rectally daily as needed for severe constipation. (Patient not  taking: Reported on 09/01/2023) 133 mL 5   thyroid  (NP THYROID ) 30 MG tablet Take by mouth.     No current facility-administered medications for this visit.   Allergies: Allergies  Allergen Reactions   Amoxicillin  Anaphylaxis    Pt reported   Banana Anaphylaxis   Cefdinir  Anaphylaxis, Rash and Swelling    throat   Corn Oil Anaphylaxis   Egg-Derived Products Anaphylaxis and Swelling    abd swelling   Gluten Meal Anaphylaxis   Other Anaphylaxis and Swelling    All dairy products make intestines swell abd swelling All nuts ALL nuts   Soy Allergy (Obsolete) Anaphylaxis and Swelling   Tilactase Swelling    All dairy products make intestines swell All dairy products make intestines swell All dairy products make intestines swell   Tinidazole Anaphylaxis and Rash   Wheat Anaphylaxis   Social History: Social History   Socioeconomic History   Marital status: Single    Spouse name: Not on file   Number of children: 0   Years of education: BA   Highest education level: Not on file  Occupational History   Not on file  Tobacco Use   Smoking status: Never    Passive exposure: Past   Smokeless tobacco: Never  Vaping Use   Vaping status: Never Used  Substance and Sexual Activity   Alcohol use: No   Drug use: No   Sexual activity: Never  Other Topics Concern   Not on file  Social History Narrative   Pt is single. Just finished her BA degree ans searching for employment.    Takes a daily vitamin.    Wears her seatbelt and bicycle helmet.    Smoke detector in  the home.    Feels safe in her relationships.    Social Drivers of Corporate investment banker Strain: Not on file  Food Insecurity: No Food Insecurity (03/10/2021)   Received [facility]   Hunger Vital Sign    Within the past 12 months, you worried that your food would run out before you got the money to buy more.: Never true    Within the past 12 months, the food you bought just didn't last and you didn't  have money to get more.: Never true  Transportation Needs: Not on file  Physical Activity: Not on file  Stress: Not on file  Social Connections: Unknown (06/29/2021)   Received from [facility]   Social Network    Social Network: Not on file   Lives in a ***. Smoking: *** Occupation: ***  Environmental HistorySurveyor, minerals in the house: Network engineer in the family room: {Blank single:19197::yes,no} Carpet in the bedroom: {Blank single:19197::yes,no} Heating: {Blank single:19197::electric,gas,heat pump} Cooling: {Blank single:19197::central,window,heat pump} Pet: {Blank single:19197::yes ***,no}  Family History: Family History  Problem Relation Age of Onset   Colon polyps Mother    Hypertension Father    Allergies Father    Hyperlipidemia Father    Colon polyps Father    Hypertension Paternal Uncle    Hypertension Maternal Grandmother    Colon cancer Maternal Grandmother 93       colon   Esophageal cancer Neg Hx    Rectal cancer Neg Hx    Stomach cancer Neg Hx    Problem                               Relation Asthma                                   *** Eczema                                *** Food allergy                          *** Allergic rhino conjunctivitis     ***  Review of Systems  Constitutional:  Negative for appetite change, chills, fever and unexpected weight change.  HENT:  Negative for congestion and rhinorrhea.   Eyes:  Negative for itching.  Respiratory:  Negative for cough, chest tightness, shortness of breath and wheezing.   Cardiovascular:  Negative for chest pain.  Gastrointestinal:  Negative for abdominal pain.  Genitourinary:  Negative for difficulty urinating.  Skin:  Negative for rash.  Neurological:  Negative for headaches.    Objective: There were no vitals taken for this visit. There is no height or weight on file to calculate BMI. Physical Exam Vitals and nursing note  reviewed.  Constitutional:      Appearance: Normal appearance. She is well-developed.  HENT:     Head: Normocephalic and atraumatic.     Right Ear: Tympanic membrane and external ear normal.     Left Ear: Tympanic membrane and external ear normal.     Nose: Nose normal.     Mouth/Throat:     Mouth: Mucous membranes are moist.     Pharynx: Oropharynx is clear.  Eyes:     Conjunctiva/sclera: Conjunctivae normal.  Cardiovascular:     Rate and Rhythm: Normal rate and regular rhythm.     Heart sounds: Normal heart sounds. No murmur heard.    No friction rub. No gallop.  Pulmonary:     Effort: Pulmonary effort is normal.     Breath sounds: Normal breath sounds. No wheezing, rhonchi or rales.  Musculoskeletal:     Cervical back: Neck supple.  Skin:    General: Skin is warm.     Findings: No rash.  Neurological:     Mental Status: She is alert and oriented to person, place, and time.  Psychiatric:        Behavior: Behavior normal.    The plan was reviewed with the patient/family, and all questions/concerned were addressed.  It was my pleasure to see Jovanna today and participate in her care. Please feel free to contact me with any questions or concerns.  Sincerely,  Orlan Cramp, DO Allergy & Immunology  [facility]  [facility] office: [phone] [facility] office: [phone]

## 2023-11-17 DIAGNOSIS — D2261 Melanocytic nevi of right upper limb, including shoulder: Secondary | ICD-10-CM | POA: Diagnosis not present

## 2023-11-17 DIAGNOSIS — L2089 Other atopic dermatitis: Secondary | ICD-10-CM | POA: Diagnosis not present

## 2023-11-17 DIAGNOSIS — D225 Melanocytic nevi of trunk: Secondary | ICD-10-CM | POA: Diagnosis not present

## 2023-11-17 DIAGNOSIS — D2262 Melanocytic nevi of left upper limb, including shoulder: Secondary | ICD-10-CM | POA: Diagnosis not present

## 2023-11-22 ENCOUNTER — Other Ambulatory Visit (INDEPENDENT_AMBULATORY_CARE_PROVIDER_SITE_OTHER)

## 2023-11-22 ENCOUNTER — Encounter: Payer: Self-pay | Admitting: Physician Assistant

## 2023-11-22 ENCOUNTER — Ambulatory Visit (INDEPENDENT_AMBULATORY_CARE_PROVIDER_SITE_OTHER)
Admission: RE | Admit: 2023-11-22 | Discharge: 2023-11-22 | Disposition: A | Discharge status: Home / Self Care | Source: Ambulatory Visit | Attending: Physician Assistant | Admitting: Physician Assistant

## 2023-11-22 ENCOUNTER — Ambulatory Visit: Admitting: Physician Assistant

## 2023-11-22 VITALS — BP 108/80 | HR 101 | Ht 68.0 in | Wt 116.1 lb

## 2023-11-22 DIAGNOSIS — K5904 Chronic idiopathic constipation: Secondary | ICD-10-CM | POA: Diagnosis not present

## 2023-11-22 DIAGNOSIS — Z83719 Family history of colon polyps, unspecified: Secondary | ICD-10-CM

## 2023-11-22 DIAGNOSIS — R1031 Right lower quadrant pain: Secondary | ICD-10-CM

## 2023-11-22 DIAGNOSIS — K5909 Other constipation: Secondary | ICD-10-CM

## 2023-11-22 DIAGNOSIS — E611 Iron deficiency: Secondary | ICD-10-CM | POA: Diagnosis not present

## 2023-11-22 DIAGNOSIS — D509 Iron deficiency anemia, unspecified: Secondary | ICD-10-CM | POA: Diagnosis not present

## 2023-11-22 DIAGNOSIS — K625 Hemorrhage of anus and rectum: Secondary | ICD-10-CM | POA: Diagnosis not present

## 2023-11-22 DIAGNOSIS — R197 Diarrhea, unspecified: Secondary | ICD-10-CM | POA: Diagnosis not present

## 2023-11-22 DIAGNOSIS — Z800 Family history of malignant neoplasm of digestive organs: Secondary | ICD-10-CM | POA: Diagnosis not present

## 2023-11-22 DIAGNOSIS — R110 Nausea: Secondary | ICD-10-CM | POA: Diagnosis not present

## 2023-11-22 DIAGNOSIS — R109 Unspecified abdominal pain: Secondary | ICD-10-CM | POA: Diagnosis not present

## 2023-11-22 DIAGNOSIS — R140 Abdominal distension (gaseous): Secondary | ICD-10-CM | POA: Diagnosis not present

## 2023-11-22 LAB — IRON,TIBC AND FERRITIN PANEL
%SAT: 13 % — ABNORMAL LOW (ref 16–45)
Ferritin: 13 ng/mL — ABNORMAL LOW (ref 16–154)
Iron: 53 ug/dL (ref 40–190)
TIBC: 395 ug/dL (ref 250–450)

## 2023-11-22 LAB — CBC WITH DIFFERENTIAL/PLATELET
Basophils Absolute: 0.1 K/uL (ref 0.0–0.1)
Basophils Relative: 1.2 % (ref 0.0–3.0)
Eosinophils Absolute: 0 K/uL (ref 0.0–0.7)
Eosinophils Relative: 0.5 % (ref 0.0–5.0)
HCT: 41.6 % (ref 36.0–46.0)
Hemoglobin: 13.9 g/dL (ref 12.0–15.0)
Lymphocytes Relative: 30.4 % (ref 12.0–46.0)
Lymphs Abs: 1.3 K/uL (ref 0.7–4.0)
MCHC: 33.3 g/dL (ref 30.0–36.0)
MCV: 88 fl (ref 78.0–100.0)
Monocytes Absolute: 0.4 K/uL (ref 0.1–1.0)
Monocytes Relative: 8.9 % (ref 3.0–12.0)
Neutro Abs: 2.5 K/uL (ref 1.4–7.7)
Neutrophils Relative %: 59 % (ref 43.0–77.0)
Platelets: 361 K/uL (ref 150.0–400.0)
RBC: 4.73 Mil/uL (ref 3.87–5.11)
RDW: 13.4 % (ref 11.5–15.5)
WBC: 4.2 K/uL (ref 4.0–10.5)

## 2023-11-22 MED ORDER — LINACLOTIDE 72 MCG PO CAPS
72.0000 ug | ORAL_CAPSULE | Freq: Every day | ORAL | 2 refills | Status: AC
Start: 2023-11-22 — End: 2024-02-20

## 2023-11-22 MED ORDER — NA SULFATE-K SULFATE-MG SULF 17.5-3.13-1.6 GM/177ML PO SOLN: 1.0000 | Freq: Once | ORAL | 0 refills | Status: AC

## 2023-11-22 NOTE — Progress Notes (Signed)
 Noted

## 2023-11-22 NOTE — Patient Instructions (Addendum)
 Your provider has requested that you go to the basement level for lab work before leaving today. Press B on the elevator. The lab is located at the first door on the left as you exit the elevator.  For Constipation Try: Samples of Linzess  72 mcg QD for 1 week,  then 145 mcg QD for 1 week,  Please let me know which dose works best, and then we can send in a prescription.      For Irritable Bowel Syndrome / Colon Spasm / Abdominal Cramps: IB Gard (Peppermint Oil) - Over the Counter Take 2 capsules Twice daily   You have been scheduled for a Colonoscopy. Please follow written instructions given to you at your visit today.   If you use inhalers (even only as needed), please bring them with you on the day of your procedure.  DO NOT TAKE 7 DAYS PRIOR TO TEST- Trulicity (dulaglutide) Ozempic, Wegovy (semaglutide) Mounjaro (tirzepatide) Bydureon Bcise (exanatide extended release)  DO NOT TAKE 1 DAY PRIOR TO YOUR TEST Rybelsus (semaglutide) Adlyxin (lixisenatide) Victoza (liraglutide) Byetta (exanatide) ___________________________________________________________________________  Please follow up sooner if symptoms increase or worsen   Due to recent changes in healthcare laws, you may see the results of your imaging and laboratory studies on MyChart before your provider has had a chance to review them.  We understand that in some cases there may be results that are confusing or concerning to you. Not all laboratory results come back in the same time frame and the provider may be waiting for multiple results in order to interpret others.  Please give us  48 hours in order for your provider to thoroughly review all the results before contacting the office for clarification of your results.   Thank you for trusting me with your gastrointestinal care!   Ellouise Console, PA-C _______________________________________________________  If your blood pressure at your visit was 140/90 or greater,  please contact your primary care physician to follow up on this.  _______________________________________________________  If you are age 31 or older, your body mass index should be between 23-30. Your Body mass index is 17.66 kg/m. If this is out of the aforementioned range listed, please consider follow up with your Primary Care Provider.  If you are age 50 or younger, your body mass index should be between 19-25. Your Body mass index is 17.66 kg/m. If this is out of the aformentioned range listed, please consider follow up with your Primary Care Provider.   ________________________________________________________  The [facility] providers would like to encourage you to use MYCHART to communicate with providers for non-urgent requests or questions.  Due to long hold times on the telephone, sending your provider a message by [facility] may be a faster and more efficient way to get a response.  Please allow 48 business hours for a response.  Please remember that this is for non-urgent requests.  _______________________________________________________

## 2023-11-22 NOTE — Progress Notes (Signed)
 Ellouise Console, PA-C [address] [place], [place]  [address] Phone: [phone]   Primary Care Physician: Catherine Charlies LABOR, DO  Primary Gastroenterologist:  Ellouise Console, PA-C / Norleen Kiang, MD   Chief Complaint: RLQ abdominal pain, constipation, rectal bleeding      HPI:   Meghan Hayes is a 33 y.o. female returns for follow-up of chronic abdominal pain, nausea, constipation, GERD.  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  She is here today with her mother.  Current symptoms: Patient states she has had constant right lower quadrant abdominal pain for 4 months.  She has been seen at urgent care, GYN, and PCP.  She has had abdominal x-ray, abdominal pelvic CT without contrast, and pelvic ultrasound, which have all been unrevealing.  It is a constant dull pain.  Nothing makes the pain better.  It is worse after eating.  Has not been relieved after bowel movement.  She has noticed a few episodes of rectal bleeding.  She is requesting a repeat colonoscopy.  Admits to weight loss.  Family history significant for grandmother with colon cancer and father who had colon polyps.  Currently taking MiraLAX and has bowel movement 2 or 3 times per week.  She does not have a bowel movement if she does not take MiraLAX.  Patient states she has iron deficiency.  She has difficulty taking oral iron.  She is requesting to have repeat colonoscopy.  PMH: Hashimoto's thyroiditis, thyroid  nodule, GERD, chronic right mid to RLQ pain and a > 1cm gallbladder polyp s/p laparoscopic cholecystectomy 04/2020.  She was evaluated at [facility] for possible sphincter of Oddi dysfunction in 2022.  EGD and colonoscopy 12/02/2018 were normal by Dr. Kiang.   History of chronic lifelong constipation.  Takes magnesium, MiraLAX, Colace.  Was given trial of Linzess  in 2023, but not currently taking.  Takes pantoprazole  40 Mg daily with good control of acid reflux.  Has  chronic nausea.  RUQ pain did not improve after cholecystectomy.  06/2020 abdominal MRI/MRCP: No acute abnormality.  No evidence of choledocholithiasis.  08/13/2023 labs: Normal CBC (Hgb 13.7).  Normal CMP and lipase.  Negative hCG.  Current Outpatient Medications  Medication Sig Dispense Refill   diphenhydrAMINE  (BENADRYL ) 25 mg capsule Take 25 mg by mouth as needed for allergies.     docusate sodium  (COLACE) 100 MG capsule Take 1 capsule (100 mg total) by mouth every 12 (twelve) hours. (Patient not taking: Reported on 09/01/2023) 60 capsule 0   EPINEPHrine  0.3 mg/0.3 mL IJ SOAJ injection INJECT 0.3 MLS (0.3 MG TOTAL) INTO THE MUSCLE ONCE AS NEEDED FOR ANAPHYLAXIS. 1 each 1   fluticasone  (FLONASE ) 50 MCG/ACT nasal spray Place 2 sprays into both nostrils daily. 16 g 6   pantoprazole  (PROTONIX ) 40 MG tablet TAKE 1 TABLET BY MOUTH TWICE A DAY 180 tablet 1   sodium phosphate (FLEET) ENEM Place 133 mLs (1 enema total) rectally daily as needed for severe constipation. (Patient not taking: Reported on 09/01/2023) 133 mL 5   thyroid  (NP THYROID ) 30 MG tablet Take by mouth.     No current facility-administered medications for this visit.    Allergies as of 11/22/2023 - Review Complete 09/01/2023  Allergen Reaction Noted   Amoxicillin  Anaphylaxis 01/08/2023   Banana Anaphylaxis 05/09/2020   Cefdinir  Anaphylaxis, Rash, and Swelling 01/23/2018   Corn oil Anaphylaxis 06/30/2012   Egg-derived products Anaphylaxis and Swelling 01/18/2018   Gluten  meal Anaphylaxis 01/31/2019   Other Anaphylaxis and Swelling 06/30/2012   Soy allergy (obsolete) Anaphylaxis and Swelling 04/14/2016   Tilactase Swelling 08/20/2014   Tinidazole Anaphylaxis and Rash 07/04/2020   Wheat Anaphylaxis 06/30/2012    Past Medical History:  Diagnosis Date   Allergy    seasonal, nuts, wheat, corn, soy   Arthritis 03/2021   Complication of ear piercing, initial encounter 03/23/2022   COVID-19 03/03/2021   Gallbladder polyp 2011    6 mm has had repeat imaging and stable.    GERD (gastroesophageal reflux disease)    Hashimoto's disease 2014   Ileocecal valve syndrome 2017   followed by dr. Leigh   Migraines    Thyroid  nodule 2014   last US  2016: 1.2cm thyroid  nodule vs PTH adenoma.     Past Surgical History:  Procedure Laterality Date   CHOLECYSTECTOMY     TONSILLECTOMY  2008   WISDOM TOOTH EXTRACTION  23   has panic attack and crying after procedure    Review of Systems:    All systems reviewed and negative except where noted in HPI.    Physical Exam:  BP 108/80   Pulse (!) 101   Ht 5' 8 (1.727 m)   Wt 116 lb 2 oz (52.7 kg)   BMI 17.66 kg/m  No LMP recorded.  General: Well-nourished, well-developed in no acute distress.  Lungs: Clear to auscultation bilaterally. Non-labored. Heart: Regular rate and rhythm, no murmurs rubs or gallops.  Abdomen: Bowel sounds are normal; Abdomen is Soft; No hepatosplenomegaly, masses or hernias;  Mild RLQ Abdominal Tenderness with questionable palpable stool in the right colon;  Rest of abdomen is not tender.  No guarding or rebound tenderness. Neuro: Alert and oriented x 3.  Grossly intact.  Psych: Alert and cooperative, anxious mood and affect.   Imaging Studies: No results found.  Labs: CBC    Component Value Date/Time   WBC 4.6 01/29/2021 0000   RBC 4.51 01/29/2021 0000   HGB 12.4 01/29/2021 0000   HCT 38.3 01/29/2021 0000   PLT 409 (H) 01/29/2021 0000   MCV 84.9 01/29/2021 0000   MCH 27.5 01/29/2021 0000   MCHC 32.4 01/29/2021 0000   RDW 13.5 01/29/2021 0000   LYMPHSABS 1,486 01/29/2021 0000   MONOABS 0.5 03/29/2020 0938   EOSABS 69 01/29/2021 0000   BASOSABS 60 01/29/2021 0000    CMP     Component Value Date/Time   NA 140 01/29/2021 0000   K 4.5 01/29/2021 0000   CL 105 01/29/2021 0000   CO2 25 01/29/2021 0000   GLUCOSE 72 01/29/2021 0000   BUN 13 01/29/2021 0000   CREATININE 0.63 01/29/2021 0000   CALCIUM 10.1 01/29/2021 0000    PROT 7.3 01/29/2021 0000   ALBUMIN 4.4 04/18/2020 1612   AST 15 01/29/2021 0000   ALT 12 01/29/2021 0000   ALKPHOS 42 04/18/2020 1612   BILITOT 0.5 01/29/2021 0000     Assessment and Plan:   Veyda Kaufman is a 33 y.o. y/o female   RLQ pain Chronic constipation IBS-C Iron deficiency Rectal bleeding Family history of colon cancer (grandmother) Family history of colon polyps (father)  I am most suspicious for irritable bowel syndrome with chronic constipation.  She has had some iron deficiency anemia and a few episodes of mild rectal bleeding.  I am scheduling repeat colonoscopy per patient request.  Recent abdominal pelvic CT, and pelvic ultrasound were unrevealing.  We discussed treatment for irritable bowel syndrome and constipation.  I gave samples of Linzess  to try.  Plan: - CBC, iron panel -Ordered abdominal x-ray 1 view, evaluate stool burden. - Gave samples of Linzess  72 mcg QD for 1 week, then 145 mcg QD for 1 week.  Patient will let me know which dose works best, and then we can send a prescription.  - Try samples of IBgard 2 capsules twice daily. - Scheduling Colonoscopy I discussed risks of colonoscopy with patient to include risk of bleeding, colon perforation, and risk of sedation.  Patient expressed understanding and agrees to proceed with colonoscopy.    Ellouise Console, PA-C  Follow up with TG or Colleen in 2 to 3 months.

## 2023-11-23 ENCOUNTER — Ambulatory Visit: Payer: Self-pay | Admitting: Physician Assistant

## 2023-12-14 ENCOUNTER — Encounter: Admitting: Internal Medicine

## 2023-12-24 ENCOUNTER — Encounter: Admitting: Internal Medicine

## 2023-12-29 ENCOUNTER — Telehealth: Payer: Self-pay | Admitting: Physician Assistant

## 2023-12-29 NOTE — Telephone Encounter (Signed)
 Patient calling in regards to prior Auth. Please advise.

## 2023-12-30 ENCOUNTER — Encounter: Payer: Self-pay | Admitting: Internal Medicine

## 2024-01-03 NOTE — Telephone Encounter (Signed)
 PT is calling in about her pre certification for the colonoscopy she's having on the 13th. Please advise.

## 2024-01-06 ENCOUNTER — Ambulatory Visit (AMBULATORY_SURGERY_CENTER): Admitting: Internal Medicine

## 2024-01-06 ENCOUNTER — Encounter: Payer: Self-pay | Admitting: Internal Medicine

## 2024-01-06 VITALS — BP 109/56 | HR 99 | Temp 98.8°F | Resp 14 | Ht 68.0 in | Wt 116.0 lb

## 2024-01-06 DIAGNOSIS — Z800 Family history of malignant neoplasm of digestive organs: Secondary | ICD-10-CM

## 2024-01-06 DIAGNOSIS — K5900 Constipation, unspecified: Secondary | ICD-10-CM

## 2024-01-06 DIAGNOSIS — K5904 Chronic idiopathic constipation: Secondary | ICD-10-CM

## 2024-01-06 DIAGNOSIS — Z83719 Family history of colon polyps, unspecified: Secondary | ICD-10-CM

## 2024-01-06 DIAGNOSIS — R1031 Right lower quadrant pain: Secondary | ICD-10-CM

## 2024-01-06 DIAGNOSIS — D509 Iron deficiency anemia, unspecified: Secondary | ICD-10-CM

## 2024-01-06 DIAGNOSIS — Z8371 Family history of adenomatous and serrated polyps: Secondary | ICD-10-CM

## 2024-01-06 DIAGNOSIS — K625 Hemorrhage of anus and rectum: Secondary | ICD-10-CM | POA: Diagnosis not present

## 2024-01-06 MED ORDER — SODIUM CHLORIDE 0.9 % IV SOLN: 500.0000 mL | INTRAVENOUS | Status: DC

## 2024-01-06 NOTE — Op Note (Signed)
 [facility] Patient Name: Meghan Hayes Procedure Date: 01/06/2024 10:13 AM MRN: [id number] Endoscopist: Norleen SAILOR. Abran , MD, [id number] Age: 33 Referring MD:  Date of Birth: [birth year] Gender: Female Account #: [id number] Procedure:                Colonoscopy Indications:              Abdominal pain in the right abdomen (constant),                            Rectal bleeding, Iron deficiency anemia.                            Grandparent with colon cancer. Father with                            adenomatous polyps. Previous examination 2020 was                            normal Medicines:                Monitored Anesthesia Care Procedure:                Pre-Anesthesia Assessment:                           - Prior to the procedure, a History and Physical                            was performed, and patient medications and                            allergies were reviewed. The patient's tolerance of                            previous anesthesia was also reviewed. The risks                            and benefits of the procedure and the sedation                            options and risks were discussed with the patient.                            All questions were answered, and informed consent                            was obtained. Prior Anticoagulants: The patient has                            taken no anticoagulant or antiplatelet agents. ASA                            Grade Assessment: II - A patient with mild systemic  disease. After reviewing the risks and benefits,                            the patient was deemed in satisfactory condition to                            undergo the procedure.                           After obtaining informed consent, the colonoscope                            was passed under direct vision. Throughout the                            procedure, the patient's blood pressure, pulse, and                             oxygen saturations were monitored continuously. The                            Olympus Scope [id number] was introduced through the                            anus and advanced to the the cecum, identified by                            appendiceal orifice and ileocecal valve. The                            terminal ileum, ileocecal valve, appendiceal                            orifice, and rectum were photographed. The quality                            of the bowel preparation was excellent. The                            colonoscopy was performed without difficulty. The                            patient tolerated the procedure well. The bowel                            preparation used was SUPREP via split dose                            instruction. Scope In: 10:21:02 AM Scope Out: 10:31:58 AM Scope Withdrawal Time: 0 hours 7 minutes 2 seconds  Total Procedure Duration: 0 hours 10 minutes 56 seconds  Findings:                 The terminal ileum appeared normal.  The entire examined colon appeared normal. No                            retroflexion due to narrow rectal vault. However,                            excellent view from anal os imaged Complications:            No immediate complications. Estimated blood loss:                            None. Estimated Blood Loss:     Estimated blood loss: none. Impression:               - The examined portion of the ileum was normal.                           - The entire examined colon is normal.                           - No specimens collected. Recommendation:           - Repeat colonoscopy at age 5 for screening                            purposes.                           - Patient has a contact number available for                            emergencies. The signs and symptoms of potential                            delayed complications were discussed with the                            patient.  Return to normal activities tomorrow.                            Written discharge instructions were provided to the                            patient.                           - Resume previous diet.                           - Continue present medications. Norleen SAILOR. Abran, MD 01/06/2024 10:38:08 AM This report has been signed electronically.

## 2024-01-06 NOTE — Progress Notes (Signed)
 Expand All Collapse All       Ellouise Console, PA-C [address] [place], [place]  [address] Phone: [phone]     Primary Care Physician: Catherine Charlies LABOR, DO   Primary Gastroenterologist:  Ellouise Console, PA-C / Norleen Kiang, MD    Chief Complaint: RLQ abdominal pain, constipation, rectal bleeding       HPI:   Meghan Hayes is a 33 y.o. female returns for follow-up of chronic abdominal pain, nausea, constipation, GERD.  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  She is here today with her mother.   Current symptoms: Patient states she has had constant right lower quadrant abdominal pain for 4 months.  She has been seen at urgent care, GYN, and PCP.  She has had abdominal x-ray, abdominal pelvic CT without contrast, and pelvic ultrasound, which have all been unrevealing.  It is a constant dull pain.  Nothing makes the pain better.  It is worse after eating.  Has not been relieved after bowel movement.  She has noticed a few episodes of rectal bleeding.  She is requesting a repeat colonoscopy.  Admits to weight loss.  Family history significant for grandmother with colon cancer and father who had colon polyps.  Currently taking MiraLAX and has bowel movement 2 or 3 times per week.  She does not have a bowel movement if she does not take MiraLAX.  Patient states she has iron deficiency.  She has difficulty taking oral iron.  She is requesting to have repeat colonoscopy.   PMH: Hashimoto's thyroiditis, thyroid  nodule, GERD, chronic right mid to RLQ pain and a > 1cm gallbladder polyp s/p laparoscopic cholecystectomy 04/2020.  She was evaluated at [facility] for possible sphincter of Oddi dysfunction in 2022.   EGD and colonoscopy 12/02/2018 were normal by Dr. Kiang.    History of chronic lifelong constipation.  Takes magnesium, MiraLAX, Colace.  Was given trial of Linzess  in 2023, but not currently taking.  Takes pantoprazole  40 Mg daily with good  control of acid reflux.  Has chronic nausea.  RUQ pain did not improve after cholecystectomy.   06/2020 abdominal MRI/MRCP: No acute abnormality.  No evidence of choledocholithiasis.   08/13/2023 labs: Normal CBC (Hgb 13.7).  Normal CMP and lipase.  Negative hCG.         Current Outpatient Medications  Medication Sig Dispense Refill   diphenhydrAMINE  (BENADRYL ) 25 mg capsule Take 25 mg by mouth as needed for allergies.       docusate sodium  (COLACE) 100 MG capsule Take 1 capsule (100 mg total) by mouth every 12 (twelve) hours. (Patient not taking: Reported on 09/01/2023) 60 capsule 0   EPINEPHrine  0.3 mg/0.3 mL IJ SOAJ injection INJECT 0.3 MLS (0.3 MG TOTAL) INTO THE MUSCLE ONCE AS NEEDED FOR ANAPHYLAXIS. 1 each 1   fluticasone  (FLONASE ) 50 MCG/ACT nasal spray Place 2 sprays into both nostrils daily. 16 g 6   pantoprazole  (PROTONIX ) 40 MG tablet TAKE 1 TABLET BY MOUTH TWICE A DAY 180 tablet 1   sodium phosphate (FLEET) ENEM Place 133 mLs (1 enema total) rectally daily as needed for severe constipation. (Patient not taking: Reported on 09/01/2023) 133 mL 5   thyroid  (NP THYROID ) 30 MG tablet Take by mouth.          No current facility-administered medications for this visit.             Allergies as of 11/22/2023 - Review Complete  09/01/2023  Allergen Reaction Noted   Amoxicillin  Anaphylaxis 01/08/2023   Banana Anaphylaxis 05/09/2020   Cefdinir  Anaphylaxis, Rash, and Swelling 01/23/2018   Corn oil Anaphylaxis 06/30/2012   Egg-derived products Anaphylaxis and Swelling 01/18/2018   Gluten meal Anaphylaxis 01/31/2019   Other Anaphylaxis and Swelling 06/30/2012   Soy allergy (obsolete) Anaphylaxis and Swelling 04/14/2016   Tilactase Swelling 08/20/2014   Tinidazole Anaphylaxis and Rash 07/04/2020   Wheat Anaphylaxis 06/30/2012          Past Medical History:  Diagnosis Date   Allergy      seasonal, nuts, wheat, corn, soy   Arthritis 03/2021   Complication of ear piercing, initial  encounter 03/23/2022   COVID-19 03/03/2021   Gallbladder polyp 2011    6 mm has had repeat imaging and stable.    GERD (gastroesophageal reflux disease)     Hashimoto's disease 2014   Ileocecal valve syndrome 2017    followed by dr. Leigh   Migraines     Thyroid  nodule 2014    last US  2016: 1.2cm thyroid  nodule vs PTH adenoma.                Past Surgical History:  Procedure Laterality Date   CHOLECYSTECTOMY       TONSILLECTOMY   2008   WISDOM TOOTH EXTRACTION   23    has panic attack and crying after procedure          Review of Systems:    All systems reviewed and negative except where noted in HPI.      Physical Exam:  BP 108/80   Pulse (!) 101   Ht 5' 8 (1.727 m)   Wt 116 lb 2 oz (52.7 kg)   BMI 17.66 kg/m  No LMP recorded.   General: Well-nourished, well-developed in no acute distress.  Lungs: Clear to auscultation bilaterally. Non-labored. Heart: Regular rate and rhythm, no murmurs rubs or gallops.  Abdomen: Bowel sounds are normal; Abdomen is Soft; No hepatosplenomegaly, masses or hernias;  Mild RLQ Abdominal Tenderness with questionable palpable stool in the right colon;  Rest of abdomen is not tender.  No guarding or rebound tenderness. Neuro: Alert and oriented x 3.  Grossly intact.  Psych: Alert and cooperative, anxious mood and affect.     Imaging Studies: Imaging Results  No results found.     Labs: CBC Labs (Brief)          Component Value Date/Time    WBC 4.6 01/29/2021 0000    RBC 4.51 01/29/2021 0000    HGB 12.4 01/29/2021 0000    HCT 38.3 01/29/2021 0000    PLT 409 (H) 01/29/2021 0000    MCV 84.9 01/29/2021 0000    MCH 27.5 01/29/2021 0000    MCHC 32.4 01/29/2021 0000    RDW 13.5 01/29/2021 0000    LYMPHSABS 1,486 01/29/2021 0000    MONOABS 0.5 03/29/2020 0938    EOSABS 69 01/29/2021 0000    BASOSABS 60 01/29/2021 0000        CMP     Labs (Brief)          Component Value Date/Time    NA 140 01/29/2021 0000    K  4.5 01/29/2021 0000    CL 105 01/29/2021 0000    CO2 25 01/29/2021 0000    GLUCOSE 72 01/29/2021 0000    BUN 13 01/29/2021 0000    CREATININE 0.63 01/29/2021 0000    CALCIUM 10.1 01/29/2021 0000    PROT  7.3 01/29/2021 0000    ALBUMIN 4.4 04/18/2020 1612    AST 15 01/29/2021 0000    ALT 12 01/29/2021 0000    ALKPHOS 42 04/18/2020 1612    BILITOT 0.5 01/29/2021 0000          Assessment and Plan:    Takina Busser is a 33 y.o. y/o female    RLQ pain Chronic constipation IBS-C Iron deficiency Rectal bleeding Family history of colon cancer (grandmother) Family history of colon polyps (father)   I am most suspicious for irritable bowel syndrome with chronic constipation.  She has had some iron deficiency anemia and a few episodes of mild rectal bleeding.  I am scheduling repeat colonoscopy per patient request.  Recent abdominal pelvic CT, and pelvic ultrasound were unrevealing.  We discussed treatment for irritable bowel syndrome and constipation.  I gave samples of Linzess  to try.   Plan: - CBC, iron panel -Ordered abdominal x-ray 1 view, evaluate stool burden. - Gave samples of Linzess  72 mcg QD for 1 week, then 145 mcg QD for 1 week.  Patient will let me know which dose works best, and then we can send a prescription.  - Try samples of IBgard 2 capsules twice daily. - Scheduling Colonoscopy I discussed risks of colonoscopy with patient to include risk of bleeding, colon perforation, and risk of sedation.  Patient expressed understanding and agrees to proceed with colonoscopy.      Ellouise Console, PA-C   Follow up with TG or Colleen in 2 to 3 month    Recent H&P as above no significant interval change.  Blood work showed normal hemoglobin.  Iron studies suggestive of iron deficiency.  She has not for colonoscopy as outlined above.

## 2024-01-06 NOTE — Patient Instructions (Signed)

## 2024-01-06 NOTE — Progress Notes (Signed)
 A/o x 3, VSS, good SR's, pleased with anesthesia, report to RN

## 2024-01-07 ENCOUNTER — Telehealth: Payer: Self-pay | Admitting: *Deleted

## 2024-01-07 NOTE — Telephone Encounter (Signed)
 Attempted f/u phone call. No answer. Left message.

## 2024-01-13 ENCOUNTER — Other Ambulatory Visit: Payer: Self-pay | Admitting: *Deleted

## 2024-01-13 DIAGNOSIS — M546 Pain in thoracic spine: Secondary | ICD-10-CM

## 2024-01-17 ENCOUNTER — Encounter: Payer: Self-pay | Admitting: *Deleted

## 2024-01-17 ENCOUNTER — Ambulatory Visit: Admitting: Nurse Practitioner

## 2024-01-25 ENCOUNTER — Encounter: Payer: Self-pay | Admitting: *Deleted

## 2024-01-26 ENCOUNTER — Other Ambulatory Visit

## 2024-01-28 ENCOUNTER — Encounter: Admitting: Internal Medicine

## 2024-02-09 ENCOUNTER — Other Ambulatory Visit

## 2024-02-21 ENCOUNTER — Other Ambulatory Visit

## 2024-02-23 ENCOUNTER — Other Ambulatory Visit
